# Patient Record
Sex: Female | Born: 1980 | Race: Black or African American | Hispanic: Yes | Marital: Single | Smoking: Never smoker
Health system: Southern US, Community
[De-identification: ages and names within clinical notes are randomized; demographics above are authoritative.]

## PROBLEM LIST (undated history)

## (undated) DIAGNOSIS — T148XXA Other injury of unspecified body region, initial encounter: Secondary | ICD-10-CM

## (undated) DIAGNOSIS — D649 Anemia, unspecified: Secondary | ICD-10-CM

## (undated) DIAGNOSIS — B9689 Other specified bacterial agents as the cause of diseases classified elsewhere: Secondary | ICD-10-CM

## (undated) DIAGNOSIS — Z8639 Personal history of other endocrine, nutritional and metabolic disease: Secondary | ICD-10-CM

## (undated) DIAGNOSIS — E079 Disorder of thyroid, unspecified: Secondary | ICD-10-CM

## (undated) DIAGNOSIS — N760 Acute vaginitis: Secondary | ICD-10-CM

## (undated) DIAGNOSIS — J45909 Unspecified asthma, uncomplicated: Secondary | ICD-10-CM

## (undated) HISTORY — DX: Disorder of thyroid, unspecified: E07.9

## (undated) HISTORY — DX: Unspecified asthma, uncomplicated: J45.909

## (undated) HISTORY — DX: Other injury of unspecified body region, initial encounter: T14.8XXA

## (undated) HISTORY — PX: WISDOM TOOTH EXTRACTION: SHX21

## (undated) HISTORY — DX: Personal history of other endocrine, nutritional and metabolic disease: Z86.39

## (undated) HISTORY — DX: Anemia, unspecified: D64.9

## (undated) HISTORY — DX: Other specified bacterial agents as the cause of diseases classified elsewhere: N76.0

## (undated) HISTORY — DX: Other specified bacterial agents as the cause of diseases classified elsewhere: B96.89

---

## 2005-12-29 ENCOUNTER — Emergency Department (HOSPITAL_COMMUNITY): Admission: EM | Admit: 2005-12-29 | Discharge: 2005-12-30 | Payer: Self-pay | Admitting: Emergency Medicine

## 2006-05-25 ENCOUNTER — Emergency Department (HOSPITAL_COMMUNITY): Admission: EM | Admit: 2006-05-25 | Discharge: 2006-05-26 | Payer: Self-pay | Admitting: Emergency Medicine

## 2006-07-31 ENCOUNTER — Inpatient Hospital Stay (HOSPITAL_COMMUNITY): Admission: AD | Admit: 2006-07-31 | Discharge: 2006-08-03 | Payer: Self-pay | Admitting: Obstetrics and Gynecology

## 2006-12-19 ENCOUNTER — Ambulatory Visit: Payer: Self-pay | Admitting: Internal Medicine

## 2007-05-28 ENCOUNTER — Encounter: Admission: RE | Admit: 2007-05-28 | Discharge: 2007-05-28 | Payer: Self-pay | Admitting: Internal Medicine

## 2007-12-23 ENCOUNTER — Emergency Department (HOSPITAL_COMMUNITY): Admission: EM | Admit: 2007-12-23 | Discharge: 2007-12-23 | Payer: Self-pay | Admitting: Emergency Medicine

## 2008-02-29 ENCOUNTER — Emergency Department (HOSPITAL_COMMUNITY): Admission: EM | Admit: 2008-02-29 | Discharge: 2008-03-01 | Payer: Self-pay | Admitting: Emergency Medicine

## 2008-03-03 ENCOUNTER — Encounter: Admission: RE | Admit: 2008-03-03 | Discharge: 2008-03-03 | Payer: Self-pay | Admitting: Internal Medicine

## 2008-04-21 ENCOUNTER — Emergency Department (HOSPITAL_COMMUNITY): Admission: EM | Admit: 2008-04-21 | Discharge: 2008-04-21 | Payer: Self-pay | Admitting: Family Medicine

## 2008-06-20 ENCOUNTER — Emergency Department (HOSPITAL_COMMUNITY): Admission: EM | Admit: 2008-06-20 | Discharge: 2008-06-21 | Payer: Self-pay | Admitting: Emergency Medicine

## 2008-09-03 ENCOUNTER — Emergency Department (HOSPITAL_COMMUNITY): Admission: EM | Admit: 2008-09-03 | Discharge: 2008-09-03 | Payer: Self-pay | Admitting: Emergency Medicine

## 2008-09-08 ENCOUNTER — Encounter: Admission: RE | Admit: 2008-09-08 | Discharge: 2008-09-08 | Payer: Self-pay | Admitting: Internal Medicine

## 2008-10-25 ENCOUNTER — Emergency Department (HOSPITAL_COMMUNITY): Admission: EM | Admit: 2008-10-25 | Discharge: 2008-10-25 | Payer: Self-pay | Admitting: Emergency Medicine

## 2009-01-05 ENCOUNTER — Emergency Department (HOSPITAL_COMMUNITY): Admission: EM | Admit: 2009-01-05 | Discharge: 2009-01-06 | Payer: Self-pay | Admitting: Emergency Medicine

## 2010-01-10 ENCOUNTER — Emergency Department (HOSPITAL_BASED_OUTPATIENT_CLINIC_OR_DEPARTMENT_OTHER)
Admission: EM | Admit: 2010-01-10 | Discharge: 2010-01-10 | Payer: Self-pay | Source: Home / Self Care | Admitting: Emergency Medicine

## 2010-04-17 LAB — CBC
HCT: 35.1 % — ABNORMAL LOW (ref 36.0–46.0)
MCH: 29.5 pg (ref 26.0–34.0)
MCHC: 35.9 g/dL (ref 30.0–36.0)
MCV: 82.2 fL (ref 78.0–100.0)
RDW: 12.5 % (ref 11.5–15.5)

## 2010-04-17 LAB — DIFFERENTIAL
Basophils Absolute: 0.1 10*3/uL (ref 0.0–0.1)
Basophils Relative: 1 % (ref 0–1)
Eosinophils Absolute: 0.2 10*3/uL (ref 0.0–0.7)
Eosinophils Relative: 4 % (ref 0–5)
Monocytes Absolute: 0.5 10*3/uL (ref 0.1–1.0)

## 2010-04-17 LAB — BASIC METABOLIC PANEL
BUN: 12 mg/dL (ref 6–23)
Chloride: 107 mEq/L (ref 96–112)
Creatinine, Ser: 0.7 mg/dL (ref 0.4–1.2)
Glucose, Bld: 90 mg/dL (ref 70–99)
Potassium: 3.8 mEq/L (ref 3.5–5.1)

## 2010-05-08 LAB — URINALYSIS, ROUTINE W REFLEX MICROSCOPIC
Bilirubin Urine: NEGATIVE
Hgb urine dipstick: NEGATIVE
Ketones, ur: 15 mg/dL — AB
Protein, ur: NEGATIVE mg/dL
Specific Gravity, Urine: 1.03 (ref 1.005–1.030)
Urobilinogen, UA: 0.2 mg/dL (ref 0.0–1.0)

## 2010-05-13 LAB — CBC
HCT: 34.9 % — ABNORMAL LOW (ref 36.0–46.0)
Hemoglobin: 11.8 g/dL — ABNORMAL LOW (ref 12.0–15.0)
MCHC: 33.8 g/dL (ref 30.0–36.0)
MCV: 86 fL (ref 78.0–100.0)
Platelets: 212 10*3/uL (ref 150–400)
RBC: 4.06 MIL/uL (ref 3.87–5.11)
RDW: 13.3 % (ref 11.5–15.5)
WBC: 5.6 10*3/uL (ref 4.0–10.5)

## 2010-05-13 LAB — DIFFERENTIAL
Basophils Absolute: 0 10*3/uL (ref 0.0–0.1)
Basophils Relative: 1 % (ref 0–1)
Eosinophils Absolute: 0.2 10*3/uL (ref 0.0–0.7)
Eosinophils Relative: 3 % (ref 0–5)
Lymphocytes Relative: 40 % (ref 12–46)
Lymphs Abs: 2.2 10*3/uL (ref 0.7–4.0)
Monocytes Absolute: 0.4 10*3/uL (ref 0.1–1.0)
Monocytes Relative: 8 % (ref 3–12)
Neutro Abs: 2.8 10*3/uL (ref 1.7–7.7)
Neutrophils Relative %: 49 % (ref 43–77)

## 2010-05-13 LAB — BASIC METABOLIC PANEL
CO2: 26 mEq/L (ref 19–32)
Calcium: 8.9 mg/dL (ref 8.4–10.5)
Glucose, Bld: 103 mg/dL — ABNORMAL HIGH (ref 70–99)
Sodium: 136 mEq/L (ref 135–145)

## 2010-05-13 LAB — URINALYSIS, ROUTINE W REFLEX MICROSCOPIC
Bilirubin Urine: NEGATIVE
Ketones, ur: NEGATIVE mg/dL
Nitrite: NEGATIVE
Urobilinogen, UA: 0.2 mg/dL (ref 0.0–1.0)

## 2010-05-13 LAB — POCT PREGNANCY, URINE: Preg Test, Ur: NEGATIVE

## 2010-05-15 LAB — URINALYSIS, ROUTINE W REFLEX MICROSCOPIC
Glucose, UA: NEGATIVE mg/dL
Hgb urine dipstick: NEGATIVE
Specific Gravity, Urine: 1.028 (ref 1.005–1.030)

## 2010-05-15 LAB — URINE CULTURE: Culture: NO GROWTH

## 2010-05-21 LAB — URINALYSIS, ROUTINE W REFLEX MICROSCOPIC
Protein, ur: NEGATIVE mg/dL
Specific Gravity, Urine: 1.032 — ABNORMAL HIGH (ref 1.005–1.030)
Urobilinogen, UA: 1 mg/dL (ref 0.0–1.0)

## 2010-05-21 LAB — URINE MICROSCOPIC-ADD ON

## 2010-06-19 NOTE — H&P (Signed)
NAMEKENNIDEE, HEYNE              ACCOUNT NO.:  [id number]   MEDICAL RECORD NO.:  [id number]          PATIENT TYPE:  INP   LOCATION:  9199                          FACILITY:  [facility]   PHYSICIAN:  Hal Morales, M.D.DATE OF BIRTH:  1980/12/17   DATE OF ADMISSION:  07/31/2006  DATE OF DISCHARGE:                              HISTORY & PHYSICAL   HISTORY OF PRESENT ILLNESS:  Ms. Klausner is a 30 year old gravida 3,  para 2-0-0-2 at 38-6/7 weeks who presented from a visit with the  preoperative nurse with uterine contractions reported every 20 minutes.  She was seen at the office today with cervix 3 cm, 70%.  She is  scheduled for repeat cesarean section on August 05, 2006.  She denies any  leaking or bleeding and reports positive fetal movement.   Pregnancy has been remarkable for:  1. Previous cesarean section with prior spontaneous vaginal birth with      the patient planning a repeat C-section.  2. History of oligohydramnios.  3. Questionable last menstrual period with EDC established by 17 week      ultrasound.  4. Group B strep negative.   PRENATAL LABS:  Blood type is B positive.  Rh antibody negative.  VDRL  nonreactive.  Rubella titer positive.  Hepatitis B surface antigen  negative.  HIV nonreactive.  Cystic fibrosis testing negative.  Sickle  cell test was negative.  Pap, GC and Chlamydia were done, which were all  normal.  Quadruple screen was normal as well.  One-hour Glucola was  negative.  Patient had a negative fetal fibronectin at 26 weeks.  Her  hemoglobin upon entry into practice was 11.3, it was 10.6 at 26 weeks.  GC and Chlamydia cultures were also negative at 28 weeks.  Group B strep  culture, GC and Chlamydia cultures were all negative at 36 weeks.  EDC  of August 09, 2006, was established by 17-week ultrasound.   HISTORY OF PRESENT PREGNANCY:  Patient had carried approximately 17  weeks.  She had been seen in maternity admissions prior to that for  nausea.  She was  placed on Ensure at her first visit secondary to weight  loss in pregnancy.  A low transverse cesarean section with double  closure was confirmed by operative note.  She initially was undecided  about repeat C-section or VBAC.  She had an ultrasound at 18 weeks  showing normal growth and development.  She had a normal Glucola.  Hemoglobin was 10.6 at 26 weeks.  She began to lean toward repeat  cesarean section.  She had another ultrasound at 32 weeks showing normal  growth at 78.6%.  By 34 weeks she was deciding on a repeat cesarean  section.  That was scheduled for August 05, 2006.   OBSTETRICAL HISTORY:  In 2003 she had a vaginal birth of a female  infant, weight 6 pounds 7 ounces at 39 weeks.  She was in labor 36  hours.  She had epidural anesthesia.  The child was born in [place],  [place].  In March of 2007 she had a primary low transverse cesarean  section for a female infant, weight 8 pounds 4 ounces at 39 weeks.  She  was in labor 17 hours.  She had epidural anesthesia.  She progressed to  8 cm and had failure to progress from that point.  She also had some low  fluid, and the baby was posterior.  She was on iron during her first  pregnancy.  She also had nausea, vomiting, a weight loss in both  pregnancies.  She had oligohydramnios with her second pregnancy.  She  had Chlamydia with her first pregnancy.  She also had positive group B  strep with her first pregnancy.   PAST MEDICAL HISTORY:  She is a previous Depo-Provera user, with which  she had bleeding after her second dose.  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  She had been on the patch and  the progesterone-only pills.  She has had a history of cysts on both  breaths that both went away.  She does have a history of yeast  infections and bacterial infections.  She has a history of bronchitis.  She has a history of anemia with her pregnancy.  She had a recent UTI  prior to pregnancy.  Surgical history  includes wisdom teeth on top  removed and her C-section.  She did have a blood pressure drop with her  epidural.  The epidural took a while to take.  She is sensitive to  tomatoes, fresh pineapple which cause swelling, cats and grass and  pollen.   FAMILY HISTORY:  Her maternal grandmother is deceased from a heart  attack.  Her mother and father have elevated cholesterol.  Her maternal  grandmother had asthma.  Her mother has anemia.  Mother had a positive  PPD.  She also had partial thyroid removed.  Her mother had thyroid  cancer and had removal of cancer cells off her cervix.  Maternal  grandfather has a history of alcohol and cirrhosis of the liver.  Genetic history is remarkable for the father of the baby's first cousin  having cerebral palsy and the father of the baby's father being a twin.   SOCIAL HISTORY:  Patient is single.  Father of the baby is involved in  sports; his name is Doree Barthel.  Patient is graduate-educated.  She  is a Futures trader.  Her partner has a high school education.  He is a  Estate agent.  She has been followed by the physician service  [facility].  She denies any alcohol, drug or tobacco use during  this pregnancy.   PHYSICAL EXAMINATION:  VITAL SIGNS:  Stable.  Patient is afebrile.  HEENT:  Within normal limits.  LUNGS:  Bilateral breath sounds are clear.  HEART:  Regular rate and rhythm without murmur.  BREASTS:  Soft and nontender.  ABDOMEN:  Fundal height is approximately 38 cm.  Estimated fetal weight  is 7 to 7-1/2 pounds.  Uterine contractions are every 2-5 minutes now,  mild quality.  Cervix is 3 cm, 70%, vertex, with a -1 station.  Fetal  heart rate is reactive with no decelerations.  EXTREMITIES:  Deep tendon reflexes are 2+ without clonus.  There is  trace edema noted.   IMPRESSION:  1. Intrauterine pregnancy at 38-6/7 weeks.  2. Probable early labor.  3. Scheduled for cesarean on August 05, 2006.  4. Group B strep  negative.   PLAN:  1. Admit to [facility] for consult with Dr. Erie Noe  Haygood as attending physician.  2. Routine physician preoperative orders.  3. Risks and benefits of repeat C-section reviewed with the patient      including bleeding, infection and damage to other organs.  This was      reviewed with the patient by myself as well as Dr. Pennie Rushing.      Patient seems to understand these risks and desires to proceed with      C-section.      Renaldo Reel Emilee Hero, C.N.M.      Hal Morales, M.D.  Electronically Signed   VLL/MEDQ  D:  07/31/2006  T:  07/31/2006  Job:  [id number]

## 2010-06-19 NOTE — Discharge Summary (Signed)
NAMESHANVI, Madeline Gomez              ACCOUNT NO.:  [id number]   MEDICAL RECORD NO.:  [id number]          PATIENT TYPE:  INP   LOCATION:  9147                          [facility]   PHYSICIAN:  Naima A. Dillard, M.D. DATE OF BIRTH:  [birth year]   DATE OF ADMISSION:  07/31/2006  DATE OF DISCHARGE:  08/03/2006                               DISCHARGE SUMMARY   ADMITTING DIAGNOSES:  1. Intrauterine pregnancy at 68 and 6/7th weeks.  2. Previous cesarean section with desire for repeat.  3. Early labor.   DISCHARGE DIAGNOSES:  1. Intrauterine pregnancy at term.  2. Prior cesarean section in labor.  3. 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   PROCEDURES:  1. Repeat low transverse cesarean section.  2. Revision of previous cheloid scar.   HOSPITAL COURSE:  Madeline Gomez is a 30 year old, gravida 3, para 2-0-0-2,  at 18 and 6/7th weeks, who presented in early labor on the morning of  July 31, 2006.  She had previously been scheduled for a repeat cesarean  section on July 1st; however, her cervix was 3 cm, 70%, vertex minus  one, the patient was contracting every two to four minutes mildly, and  the decision was made to proceed with repeat cesarean section.  The  patient was taken to the operating room where a repeat low transverse  cesarean section was performed by Dr. Dierdre Forth under spinal  anesthesia.  The patient also had a removal of the cheloid scar from her  previous incision and scar revision.  Findings were a viable female by  the name of Madeline Gomez, weight 7 pounds 6 ounces, Apgar's were 8 and 9.  The infant was taken to the full-term nursery, mother was taken to  recovery in good condition.  By postop day one, the patient was doing  well.  She was having good pain management.  Her hemoglobin was 10.5,  white blood cell count was 8.8, and platelet count was 124.  It had been  143 on her pre-delivery value.  It had been 225 at her new OB visit.  The patient did begin to have some  cracked nipples.  Comfort measures  and relief measures were submitted for this.  By postop day three, the  patient was doing well.  She was up ad.lib and tolerating a regular  diet, although she did have diminished appetite as she had had  throughout her pregnancy.  She had been on Ensure during her pregnancy,  one can p.o. t.i.d., for nutritional supplementation.  By postop day  three, she was breastfeeding and that was going fairly well.  The  patient was deemed to have received full benefit of her hospital stay  and was discharged home.  She was going to use Micronor for birth  control at present.   DISCHARGE CONDITION:  Stable.   DISCHARGE INSTRUCTIONS:  Per [facility] handout.   DISCHARGE MEDICATIONS:  1. Motrin 600 mg p.o. q.6h p.r.n. pain.  2. Percocet 5/325 one to two p.o. q.3-4h p.r.n. pain.  3. Micronor one p.o. daily.  4. Ensure one can p.o. t.i.d.   DISCHARGE  FOLLOWUP:  Discharge followup will occur in six weeks at  [facility].      Madeline Gomez, C.N.M.      Naima A. Normand Sloop, M.D.  Electronically Signed    VLL/MEDQ  D:  08/03/2006  T:  08/03/2006  Job:  [id number]

## 2010-06-19 NOTE — Op Note (Signed)
Madeline Gomez, Madeline Gomez              ACCOUNT NO.:  [id number]   MEDICAL RECORD NO.:  [id number]          PATIENT TYPE:  INP   LOCATION:  9147                          [facility]   PHYSICIAN:  Hal Morales, M.D.DATE OF BIRTH:  [birth year]   DATE OF PROCEDURE:  07/31/2006  DATE OF DISCHARGE:                               OPERATIVE REPORT   PREOPERATIVE DIAGNOSIS:  Intrauterine pregnancy at term, prior cesarean  section in labor, desire for repeat cesarean section, keloid scar.   POSTOPERATIVE DIAGNOSES:  Intrauterine pregnancy at term, prior cesarean  section in labor, desire for repeat cesarean section, keloid scar.   OPERATION:  Repeat low transverse cesarean section, incision revision.   SURGEON:  Dr. Dierdre Forth   FIRST ASSISTANT:  Renaldo Reel. Emilee Hero, C.N.M.   ANESTHESIA:  Spinal.   ESTIMATED BLOOD LOSS:  750 mL.   COMPLICATIONS:  None.   FINDINGS:  The patient was delivered of a female infant whose name is  Brooklynne weighing 7 pounds 6 ounces with Apgars of 8 and 9 at 1 and 5  minutes respectively.  The uterus, tubes and ovaries were normal for  gravid state.   PROCEDURE:  The patient was taken to the operating room after  appropriate identification and placed on the operating table.  After the  placement of a spinal anesthetic she was placed in the supine position  with a left lateral tilt.  The abdomen and perineum were prepped with  multiple layers of Betadine and a Foley catheter inserted into the  bladder and connected to straight drainage.  The abdomen was draped as a  sterile field.  After assurance of adequate anesthesia, the incision  site was infiltrated with 10 mL of quarter percent Marcaine.  The skin  was incised above and beyond the previous incision and that scar removed  sharply.  The abdomen was then opened in layers and the peritoneum  entered.  The bladder blade was placed.  The uterus was incised  approximately 2 cm above the uterovesical  fold and that incision taken  laterally on either side bluntly.  The infant was delivered from the  occiput transverse position and after having the nares and pharynx  suctioned and cord clamped and cut was handed off to the awaiting  pediatricians.  The appropriate cord blood was drawn and placenta noted  to have separated from the uterus and was removed from the operative  field.  The uterine incision was closed with a running interlocking  suture of 0 Vicryl.  An imbricating suture of 0 Vicryl was placed with  adequate hemostasis.  Copious irrigation was carried out.  The abdominal  peritoneum was closed with a running suture of 2-0 Vicryl.  The rectus  muscles were reapproximated in the midline with figure-of-eight suture  of 2-0 Vicryl.  The rectus fascia was closed with running suture of 0  Vicryl then reinforced on either side of midline with figure-of-eight  sutures of 0 Vicryl.  The subcutaneous tissue was made hemostatic with  Bovie cautery and irrigated.  The skin incision was closed with a  subcuticular suture  of 3-0 Monocryl.  Steri-Strips and a sterile  dressing were applied.  The patient was taken from the operating room to  the recovery room in satisfactory condition having tolerated the  procedure well with sponge and instrument counts correct.  The infant  went to the full-term nursery.      Hal Morales, M.D.  Electronically Signed     VPH/MEDQ  D:  07/31/2006  T:  07/31/2006  Job:  [id number]

## 2010-11-06 LAB — BASIC METABOLIC PANEL
BUN: 6
CO2: 25
Chloride: 105
Creatinine, Ser: 0.72
GFR calc Af Amer: >60

## 2010-11-06 LAB — URINALYSIS, ROUTINE W REFLEX MICROSCOPIC
Glucose, UA: NEGATIVE
Nitrite: NEGATIVE
Protein, ur: NEGATIVE

## 2010-11-06 LAB — CBC
MCHC: 33.3
MCV: 87.9
RBC: 4.05

## 2010-11-06 LAB — DIFFERENTIAL
Basophils Relative: 0
Eosinophils Absolute: 0
Monocytes Relative: 9
Neutrophils Relative %: 84 — ABNORMAL HIGH

## 2010-11-06 LAB — URINE MICROSCOPIC-ADD ON

## 2010-11-21 LAB — CBC
HCT: 30.9 — ABNORMAL LOW
MCHC: 33
Platelets: 124 — ABNORMAL LOW
Platelets: 143 — ABNORMAL LOW
RBC: 4.15
RDW: 13.9
WBC: 8.8

## 2010-11-21 LAB — RPR: RPR Ser Ql: NONREACTIVE

## 2011-10-09 ENCOUNTER — Encounter: Payer: Self-pay | Admitting: Obstetrics and Gynecology

## 2011-10-09 ENCOUNTER — Ambulatory Visit (INDEPENDENT_AMBULATORY_CARE_PROVIDER_SITE_OTHER): Payer: 59 | Admitting: Obstetrics and Gynecology

## 2011-10-09 VITALS — BP 112/72 | HR 78 | Wt 154.0 lb

## 2011-10-09 DIAGNOSIS — Z30433 Encounter for removal and reinsertion of intrauterine contraceptive device: Secondary | ICD-10-CM

## 2011-10-09 DIAGNOSIS — Z30430 Encounter for insertion of intrauterine contraceptive device: Secondary | ICD-10-CM

## 2011-10-09 DIAGNOSIS — N898 Other specified noninflammatory disorders of vagina: Secondary | ICD-10-CM

## 2011-10-09 LAB — POCT WET PREP (WET MOUNT)
Whiff Test: NEGATIVE
pH: 4.5

## 2011-10-09 LAB — POCT URINE PREGNANCY: Preg Test, Ur: NEGATIVE

## 2011-10-09 MED ORDER — AMBULATORY NON FORMULARY MEDICATION: 600.0000 mg | Status: DC

## 2011-10-09 MED ORDER — LEVONORGESTREL 20 MCG/24HR IU IUD
INTRAUTERINE_SYSTEM | Freq: Once | INTRAUTERINE | Status: AC
  Administered 2011-10-09: 1 via INTRAUTERINE

## 2011-10-09 NOTE — Progress Notes (Signed)
IUD INSERTION NOTE  Madeline Gomez is a 31 y.o. female G3P0003 who presents for IUD removal and reinsertion.  Complains of vaginal discharge (brownish) that she would like checked. Symptoms are the same as previous BV infections for which Boric Acid Capsules helped but was unable to afford them until recently.  Consent signed after risks and benefits were reviewed including but not limited to bleeding, infection, expulsion and risk of uterine perforation that may require an additional procedure for removal.  LMP: No LMP recorded. Patient is not currently having periods (Reason: IUD). UJW:[id number]   Uterus assessed for size and position Prepped with Betadine Tenaculum placed on anterior lip of cervix after Hurricane gel was applied Uterus sounded at  9  cm Insertion of MIRENA IUD per protocol without any complications Strings trimmed  Wet Prep: negative  Assessment:  IUD Insertion                        Vaginitis Symptoms  Plan:  1. Patient instructed to call with oral temperature of 100.4 degrees Fahrenheit or more, excessive bleeding or pain that is not relieved with OTC analgesia taken as directed  2. Patient instructed on how  to check IUD strings and encouraged to do so after each menstrual cycle  3. Advised not to place anything in vagina or have sexual intercourse for 7 days  4. Follow-up: 4  weeks  5. Boric Acid Suppositories 600 mg #30  1 pv twice weekly x 12 weeks then prn refills x 11   Eunice Oldaker PA-C 10/09/2011 9:38 AM

## 2011-10-09 NOTE — Patient Instructions (Addendum)
Keep follow up appointment in 4 weeks  Call [facility] [phone]:  -for temperature of 100.4 degrees Fahrenheit or more -pain not improved with over the counter pain medications (Ibuprofen, Advil, Aleve,        Tylenol or acetaminophen) -for excessive bleeding (more than a usual period) -for any other concerns  Do not place anything in your vagina for the next 7 days   

## 2011-10-28 ENCOUNTER — Ambulatory Visit: Payer: Self-pay | Admitting: Obstetrics and Gynecology

## 2011-11-08 ENCOUNTER — Encounter: Payer: 59 | Admitting: Obstetrics and Gynecology

## 2011-11-11 ENCOUNTER — Encounter: Payer: 59 | Admitting: Obstetrics and Gynecology

## 2011-11-11 ENCOUNTER — Ambulatory Visit: Payer: 59 | Admitting: Obstetrics and Gynecology

## 2011-11-12 ENCOUNTER — Ambulatory Visit: Payer: 59 | Admitting: Obstetrics and Gynecology

## 2011-11-25 ENCOUNTER — Ambulatory Visit (INDEPENDENT_AMBULATORY_CARE_PROVIDER_SITE_OTHER): Payer: 59 | Admitting: Obstetrics and Gynecology

## 2011-11-25 ENCOUNTER — Encounter: Payer: Self-pay | Admitting: Obstetrics and Gynecology

## 2011-11-25 VITALS — BP 118/78 | HR 80 | Ht 63.25 in | Wt 164.0 lb

## 2011-11-25 DIAGNOSIS — Z113 Encounter for screening for infections with a predominantly sexual mode of transmission: Secondary | ICD-10-CM

## 2011-11-25 DIAGNOSIS — Z124 Encounter for screening for malignant neoplasm of cervix: Secondary | ICD-10-CM

## 2011-11-25 DIAGNOSIS — N898 Other specified noninflammatory disorders of vagina: Secondary | ICD-10-CM

## 2011-11-25 DIAGNOSIS — Z01419 Encounter for gynecological examination (general) (routine) without abnormal findings: Secondary | ICD-10-CM

## 2011-11-25 DIAGNOSIS — E559 Vitamin D deficiency, unspecified: Secondary | ICD-10-CM

## 2011-11-25 NOTE — Progress Notes (Signed)
Regular Periods: no  IUD Mammogram: no  Monthly Breast Ex.: yes Exercise: no  Tetanus < 10 years: no Seatbelts: yes  NI. Bladder Functn.: yes Abuse at home: no  Daily BM's: yes Stressful Work: no  Healthy Diet: yes Sigmoid-Colonoscopy: NO  Calcium: no Medical problems this year: CHECK TO MAKE SURE IUD IS IN PLACE   LAST PAP:8/11  Contraception: IUD MIRENA  Mammogram:  NO  PCP:  DR. Lovell Sheehan  PMH: NO CHANGE  FMH: NO CHANGE  Last Bone Scan: NO  PT IS SINGLE;IN A RELATIONSHIP

## 2011-11-25 NOTE — Progress Notes (Signed)
Subjective:    Madeline Gomez is a 31 y.o. female, G3P0003, who presents for an annual exam. The patient requests STD testing. Has no complaints since IUD reinsertion last month.  Menstrual cycle:   LMP: Patient's last menstrual period was 11/25/2011.              Review of Systems Pertinent items are noted in HPI. Denies pelvic pain, urinary tract symptoms, vaginitis symptoms, irregular bleeding, menopausal symptoms, change in bowel habits or rectal bleeding   Objective:    BP 118/78  Pulse 80  Ht 5' 3.25" (1.607 m)  Wt 164 lb (74.39 kg)  BMI 28.82 kg/m2  LMP 11/25/2011    Wt Readings from Last 1 Encounters:  11/25/11 164 lb (74.39 kg)   Body mass index is 28.82 kg/(m^2). General Appearance: Alert, no acute distress HEENT: Grossly normal Neck / Thyroid: Supple, no thyromegaly or cervical adenopathy Lungs: Clear to auscultation bilaterally Back: No CVA tenderness Breast Exam: No masses or nodes.No dimpling, nipple retraction or discharge. Cardiovascular: Regular rate and rhythm.  Gastrointestinal: Soft, non-tender, no masses or organomegaly Pelvic Exam: EGBUS-wnl, vagina-normal rugae, cervix-strings visible,  without lesions or tenderness, uterus appears normal size shape and consistency, adnexae-no masses or tenderness Rectovaginal: no masses and normal sphincter tone Lymphatic Exam: Non-palpable nodes in neck, clavicular,  axillary, or inguinal regions  Skin: no rashes or abnormalities Extremities: no clubbing cyanosis or edema  Neurologic: grossly normal Psychiatric: Alert and oriented  Wet Prep: pH-4.5, whiff-negative,  no clue, trich, yeast   Assessment:   Routine GYN Exam   Plan:  IUD Follow up  PAP sent  RTO 1 year or prn  June Vacha,ELMIRAPA-C

## 2011-11-26 ENCOUNTER — Other Ambulatory Visit: Payer: Self-pay

## 2011-11-26 ENCOUNTER — Telehealth: Payer: Self-pay

## 2011-11-26 DIAGNOSIS — E559 Vitamin D deficiency, unspecified: Secondary | ICD-10-CM

## 2011-11-26 LAB — PAP IG, CT-NG, RFX HPV ASCU

## 2011-11-26 LAB — HSV 2 ANTIBODY, IGG: HSV 2 Glycoprotein G Ab, IgG: <0.1 IV

## 2011-11-26 LAB — HEPATITIS B SURFACE ANTIGEN: Hepatitis B Surface Ag: NEGATIVE

## 2011-11-26 LAB — HEPATITIS C ANTIBODY: HCV Ab: NEGATIVE

## 2011-11-26 MED ORDER — VITAMIN D (ERGOCALCIFEROL) 1.25 MG (50000 UNIT) PO CAPS: ORAL_CAPSULE | ORAL | Status: DC

## 2011-11-26 NOTE — Telephone Encounter (Signed)
TC TO PT REGARDING VIT D LEVEL. INFORMED PT THAT VIT D IS LOW AND WE NEED TO CALL IN RX FOR PT. WENT OVER VIT D PROTOCOL AND WILL SEND RX TO PT PHARMACY. PT VOICED UNDERSTANDING.

## 2011-11-26 NOTE — Telephone Encounter (Signed)
Message copied by Winfred Leeds on Tue Nov 26, 2011  2:56 PM ------      Message from: Henreitta Leber      Created: Tue Nov 26, 2011  7:58 AM       Please execute the Vitamin D Protocol for this patient.  Thank you.  EP

## 2011-12-11 DIAGNOSIS — J45901 Unspecified asthma with (acute) exacerbation: Secondary | ICD-10-CM | POA: Insufficient documentation

## 2011-12-11 DIAGNOSIS — R0789 Other chest pain: Secondary | ICD-10-CM | POA: Insufficient documentation

## 2011-12-11 DIAGNOSIS — Z79899 Other long term (current) drug therapy: Secondary | ICD-10-CM | POA: Insufficient documentation

## 2011-12-11 DIAGNOSIS — Z862 Personal history of diseases of the blood and blood-forming organs and certain disorders involving the immune mechanism: Secondary | ICD-10-CM | POA: Insufficient documentation

## 2011-12-11 DIAGNOSIS — E559 Vitamin D deficiency, unspecified: Secondary | ICD-10-CM | POA: Insufficient documentation

## 2011-12-12 ENCOUNTER — Encounter (HOSPITAL_COMMUNITY): Payer: Self-pay | Admitting: Adult Health

## 2011-12-12 ENCOUNTER — Emergency Department (HOSPITAL_COMMUNITY)
Admission: EM | Admit: 2011-12-12 | Discharge: 2011-12-12 | Disposition: A | Discharge status: Home / Self Care | Payer: 59 | Attending: Emergency Medicine | Admitting: Emergency Medicine

## 2011-12-12 DIAGNOSIS — J45901 Unspecified asthma with (acute) exacerbation: Secondary | ICD-10-CM

## 2011-12-12 MED ORDER — ALBUTEROL SULFATE (2.5 MG/3ML) 0.083% IN NEBU: 2.5000 mg | INHALATION_SOLUTION | Freq: Four times a day (QID) | RESPIRATORY_TRACT | Status: DC | PRN

## 2011-12-12 MED ORDER — IPRATROPIUM BROMIDE 0.02 % IN SOLN
0.5000 mg | Freq: Once | RESPIRATORY_TRACT | Status: AC
  Administered 2011-12-12: 0.5 mg via RESPIRATORY_TRACT
  Filled 2011-12-12: qty 2.5

## 2011-12-12 MED ORDER — METHYLPREDNISOLONE SODIUM SUCC 125 MG IJ SOLR
125.0000 mg | Freq: Once | INTRAMUSCULAR | Status: AC
  Administered 2011-12-12: 125 mg via INTRAMUSCULAR
  Filled 2011-12-12: qty 2

## 2011-12-12 MED ORDER — ALBUTEROL SULFATE HFA 108 (90 BASE) MCG/ACT IN AERS
2.0000 | INHALATION_SPRAY | RESPIRATORY_TRACT | Status: DC | PRN
  Administered 2011-12-12: 2 via RESPIRATORY_TRACT
  Filled 2011-12-12: qty 6.7

## 2011-12-12 MED ORDER — ALBUTEROL SULFATE (5 MG/ML) 0.5% IN NEBU
5.0000 mg | INHALATION_SOLUTION | Freq: Once | RESPIRATORY_TRACT | Status: AC
  Administered 2011-12-12: 5 mg via RESPIRATORY_TRACT
  Filled 2011-12-12: qty 1

## 2011-12-12 NOTE — ED Notes (Signed)
MD at bedside. 

## 2011-12-12 NOTE — ED Notes (Signed)
Presents with SOB that began two weeks ago and is intermittent. Has a hx of asthma and has gone through a box of albuterol in 3 days. Pt is able to speak in full sentences, bilateral diminished lower breath sounds. Reports sharp left sided pain that is worse with movement and is intermittent, took a albuterol breathing treatment at 6 pm and the chest pain subsided.

## 2011-12-12 NOTE — ED Notes (Signed)
Pt A.O x 4.  Respirations even and regular. NAD. Skin warm, dry, and intact. Denies SOB. Denies chest tightness. Denies pain. Verbalized understanding of discharge instructions.  Able to locate information for follow up with PCP. No further questions at this time.

## 2011-12-12 NOTE — ED Notes (Signed)
Pt ambulated with pulse Oximetry. Base line SpO2 100%. Maintained SpO2 98% or higher during ambulation. Denies SOB.

## 2011-12-12 NOTE — ED Provider Notes (Signed)
History     CSN: [id number]  Arrival date & time 12/11/11  2353   First MD Initiated Contact with Patient 12/12/11 0308      Chief Complaint  Patient presents with  . Asthma    (Consider location/radiation/quality/duration/timing/severity/associated sxs/prior treatment) HPI HX per PT. Has asthma and over the last few days worsening cough and wheezing, worse tonight with associated chest tightness, no h/o admit or intubation for asthma. No F/C, no productive cough, mod in severity, no back pain. No leg pain.  Past Medical History  Diagnosis Date  . H/O vitamin D deficiency   . Anemia   . Asthma   . BV (bacterial vaginosis)     RECURRENT  . Broken bones     RIGHT FOOT     Past Surgical History  Procedure Date  . Cesarean section   . Wisdom tooth extraction     Family History  Problem Relation Age of Onset  . Glaucoma Paternal Grandfather   . Glaucoma Paternal Grandmother   . Heart disease Maternal Grandmother   . Asthma Maternal Grandmother   . Anemia Maternal Grandmother   . Hyperlipidemia Father   . Cancer Mother     THYROID AND CERVICAL  . Hypertension Mother     History  Substance Use Topics  . Smoking status: Never Smoker   . Smokeless tobacco: Never Used  . Alcohol Use: Yes    OB History    Grav Para Term Preterm Abortions TAB SAB Ect Mult Living   3 3  0      3      Review of Systems  Constitutional: Negative for fever and chills.  HENT: Negative for sore throat, trouble swallowing, neck pain and neck stiffness.   Eyes: Negative for pain.  Respiratory: Positive for cough, chest tightness and wheezing.   Cardiovascular: Negative for palpitations and leg swelling.  Gastrointestinal: Negative for abdominal pain.  Genitourinary: Negative for dysuria.  Musculoskeletal: Negative for back pain.  Skin: Negative for rash.  Neurological: Negative for headaches.  All other systems reviewed and are negative.    Allergies  Other; Percocet; and  Prednisone  Home Medications   Current Outpatient Rx  Name  Route  Sig  Dispense  Refill  . ALBUTEROL SULFATE HFA 108 (90 BASE) MCG/ACT IN AERS   Inhalation   Inhale 2 puffs into the lungs every 6 (six) hours as needed. Wheezing or shortness of breath         . ALBUTEROL SULFATE (2.5 MG/3ML) 0.083% IN NEBU   Nebulization   Take 2.5 mg by nebulization every 6 (six) hours as needed. Wheezing or shortness of breath         . VITAMIN D (ERGOCALCIFEROL) 50000 UNITS PO CAPS      TAKE ONE CAPSULE 1 TIME WEEKLY FOR 12 WEEKS.   20 capsule   0     BP 101/63  Pulse 83  Temp 98.1 F (36.7 C) (Oral)  Resp 12  SpO2 100%  LMP 11/25/2011  Physical Exam  Constitutional: She is oriented to person, place, and time. She appears well-developed and well-nourished.  HENT:  Head: Normocephalic and atraumatic.  Mouth/Throat: Oropharynx is clear and moist.  Eyes: EOM are normal. Pupils are equal, round, and reactive to light.  Neck: Neck supple. No tracheal deviation present.  Cardiovascular: Normal rate, regular rhythm and intact distal pulses.   Pulmonary/Chest: Effort normal. No stridor. No respiratory distress.       Mild bilateral exp  wheezes  Abdominal: Soft. Bowel sounds are normal. There is no tenderness. There is no rebound.  Musculoskeletal: Normal range of motion. She exhibits no edema and no tenderness.  Neurological: She is alert and oriented to person, place, and time.  Skin: Skin is warm and dry.    ED Course  Procedures (including critical care time)   1. Acute asthma flare    Albuterol and solu-medrol for h/o prednisone allergy ( rash, and itching).    Recheck lungs clear, given 2 puffs albuterol inhaler.   5:36 AM feeling much better, NAD, lungs clear, stable for d./c home. Albuterol RX provided. Plan close PCP follow up/ return here for any return of symptoms. No indication for admit or further work up.   MDM    acute asthma exacerbation - improved with  medications as above. VS and nursing notes reviewed. Serial evaluations performed. Inhaler provided         Sunnie Nielsen, MD 12/12/11 [phone]

## 2011-12-12 NOTE — ED Notes (Signed)
Pt A.O. X 4. Denies SOB. Respirations even and at regular rate. Lung sounds diminished bilaterally. Complains of chest tightness.

## 2011-12-12 NOTE — ED Notes (Signed)
Pt reports chest tightness bilaterally.

## 2012-03-21 ENCOUNTER — Other Ambulatory Visit: Payer: Self-pay

## 2012-03-30 ENCOUNTER — Telehealth: Payer: Self-pay | Admitting: Obstetrics and Gynecology

## 2012-03-30 ENCOUNTER — Encounter: Payer: Self-pay | Admitting: Obstetrics and Gynecology

## 2012-03-30 NOTE — Telephone Encounter (Signed)
Fax received for Rf Vit. D. Pt has not had repeat lab. TC to pt. LM to return call.

## 2012-03-30 NOTE — Progress Notes (Unsigned)
FAx received to RF Vit D. Last RF 03/17/12. Denied. Pt to call office  if needs RF.

## 2012-05-10 ENCOUNTER — Encounter (HOSPITAL_COMMUNITY): Payer: Self-pay | Admitting: Physical Medicine and Rehabilitation

## 2012-05-10 ENCOUNTER — Emergency Department (HOSPITAL_COMMUNITY)
Admission: EM | Admit: 2012-05-10 | Discharge: 2012-05-10 | Disposition: A | Discharge status: Home / Self Care | Payer: 59 | Attending: Emergency Medicine | Admitting: Emergency Medicine

## 2012-05-10 DIAGNOSIS — Z8742 Personal history of other diseases of the female genital tract: Secondary | ICD-10-CM | POA: Insufficient documentation

## 2012-05-10 DIAGNOSIS — J309 Allergic rhinitis, unspecified: Secondary | ICD-10-CM | POA: Insufficient documentation

## 2012-05-10 DIAGNOSIS — Z79899 Other long term (current) drug therapy: Secondary | ICD-10-CM | POA: Insufficient documentation

## 2012-05-10 DIAGNOSIS — Z8781 Personal history of (healed) traumatic fracture: Secondary | ICD-10-CM | POA: Insufficient documentation

## 2012-05-10 DIAGNOSIS — J45909 Unspecified asthma, uncomplicated: Secondary | ICD-10-CM | POA: Insufficient documentation

## 2012-05-10 DIAGNOSIS — Z862 Personal history of diseases of the blood and blood-forming organs and certain disorders involving the immune mechanism: Secondary | ICD-10-CM | POA: Insufficient documentation

## 2012-05-10 DIAGNOSIS — Z8639 Personal history of other endocrine, nutritional and metabolic disease: Secondary | ICD-10-CM | POA: Insufficient documentation

## 2012-05-10 DIAGNOSIS — J45901 Unspecified asthma with (acute) exacerbation: Secondary | ICD-10-CM | POA: Insufficient documentation

## 2012-05-10 DIAGNOSIS — R059 Cough, unspecified: Secondary | ICD-10-CM | POA: Insufficient documentation

## 2012-05-10 DIAGNOSIS — R05 Cough: Secondary | ICD-10-CM | POA: Insufficient documentation

## 2012-05-10 MED ORDER — DEXAMETHASONE SODIUM PHOSPHATE 10 MG/ML IJ SOLN
10.0000 mg | Freq: Once | INTRAMUSCULAR | Status: AC
  Administered 2012-05-10: 10 mg via INTRAMUSCULAR
  Filled 2012-05-10: qty 1

## 2012-05-10 MED ORDER — BUDESONIDE 180 MCG/ACT IN AEPB: 1.0000 | INHALATION_SPRAY | Freq: Two times a day (BID) | RESPIRATORY_TRACT | Status: DC

## 2012-05-10 MED ORDER — ALBUTEROL SULFATE HFA 108 (90 BASE) MCG/ACT IN AERS: 2.0000 | INHALATION_SPRAY | RESPIRATORY_TRACT | Status: DC | PRN

## 2012-05-10 MED ORDER — ALBUTEROL SULFATE (5 MG/ML) 0.5% IN NEBU
2.5000 mg | INHALATION_SOLUTION | RESPIRATORY_TRACT | Status: DC
  Administered 2012-05-10: 2.5 mg via RESPIRATORY_TRACT
  Filled 2012-05-10: qty 0.5

## 2012-05-10 MED ORDER — BECLOMETHASONE DIPROPIONATE 80 MCG/ACT IN AERS: 1.0000 | INHALATION_SPRAY | Freq: Two times a day (BID) | RESPIRATORY_TRACT | Status: DC

## 2012-05-10 MED ORDER — IPRATROPIUM BROMIDE 0.02 % IN SOLN
0.5000 mg | RESPIRATORY_TRACT | Status: DC
  Administered 2012-05-10: 0.5 mg via RESPIRATORY_TRACT
  Filled 2012-05-10: qty 2.5

## 2012-05-10 NOTE — ED Provider Notes (Signed)
History    This chart was scribed for non-physician practitioner working with Vida Roller, MD by Sofie Rower, ED Scribe. This patient was seen in room TR09C/TR09C and the patient's care was started at 3:59PM.   CSN: [id number]  Arrival date & time 05/10/12  1556   First MD Initiated Contact with Patient 05/10/12 1559       Chief Complaint  Patient presents with  . Asthma  . Shortness of Breath    (Consider location/radiation/quality/duration/timing/severity/associated sxs/prior treatment) Patient is a 32 y.o. female presenting with shortness of breath. The history is provided by the patient. No language interpreter was used.  Shortness of Breath Severity:  Moderate Onset quality:  Gradual Duration:  2 weeks Timing:  Constant Progression:  Worsening Chronicity:  Recurrent Context: known allergens   Relieved by:  Nothing Worsened by:  Weather changes and coughing Ineffective treatments:  Inhaler (albuterol inhlaer, nebulizer) Associated symptoms: cough and wheezing   Associated symptoms: no abdominal pain, no chest pain, no claudication, no diaphoresis, no ear pain, no fever, no headaches, no hemoptysis, no neck pain, no PND, no rash, no sore throat, no sputum production, no syncope, no swollen glands and no vomiting   Cough:    Cough characteristics:  Non-productive   Sputum characteristics:  Unable to specify   Severity:  Moderate   Onset quality:  Gradual   Duration:  2 weeks   Timing:  Intermittent   Progression:  Worsening   Chronicity:  New Wheezing:    Severity:  Moderate   Onset quality:  Gradual   Duration:  2 weeks   Timing:  Constant   Progression:  Worsening   Chronicity:  Recurrent   Madeline Gomez is a 32 y.o. female , with a hx of asthma, seasonal allergies, and anemia, who presents to the Emergency Department complaining of gradual, progressively worsening, shortness of breath, onset two weeks ago.  Associated symptoms include wheezing, non productive  cough. The pt reports she has a hx of asthma and seasonal allergies and has been taking her sons albuterol inhaler (2-3 puffs at each setting) in addition to utilizing his nebulizer, however, neither of which provide relief of her asthma. The pt also explains, the reason why she has been utilizing her sons medications is due to running out of her own scheduled dosage. Additionally, pt has taken zyrtec which she informs does not provide relief of her asthmatic symptoms.    Furthermore, the pt denies any previous hospitalizations with regards to her asthma.   The pt does not smoke, however, she does drink alcohol.   PCP is Dr. Lovell Sheehan.    Past Medical History  Diagnosis Date  . H/O vitamin D deficiency   . Anemia   . Asthma   . BV (bacterial vaginosis)     RECURRENT  . Broken bones     RIGHT FOOT     Past Surgical History  Procedure Laterality Date  . Cesarean section    . Wisdom tooth extraction      Family History  Problem Relation Age of Onset  . Glaucoma Paternal Grandfather   . Glaucoma Paternal Grandmother   . Heart disease Maternal Grandmother   . Asthma Maternal Grandmother   . Anemia Maternal Grandmother   . Hyperlipidemia Father   . Cancer Mother     THYROID AND CERVICAL  . Hypertension Mother     History  Substance Use Topics  . Smoking status: Never Smoker   . Smokeless tobacco: Never Used  .  Alcohol Use: Yes    OB History   Grav Para Term Preterm Abortions TAB SAB Ect Mult Living   3 3  0      3      Review of Systems  Constitutional: Negative for fever and diaphoresis.  HENT: Negative for ear pain, sore throat and neck pain.   Respiratory: Positive for cough, shortness of breath and wheezing. Negative for hemoptysis and sputum production.   Cardiovascular: Negative for chest pain, claudication, syncope and PND.  Gastrointestinal: Negative for vomiting and abdominal pain.  Skin: Negative for rash.  Neurological: Negative for headaches.     Allergies  Other; Percocet; and Prednisone  Home Medications   Current Outpatient Rx  Name  Route  Sig  Dispense  Refill  . albuterol (PROVENTIL HFA;VENTOLIN HFA) 108 (90 BASE) MCG/ACT inhaler   Inhalation   Inhale 3-6 puffs into the lungs every 6 (six) hours as needed. Wheezing or shortness of breath         . cetirizine (ZYRTEC) 10 MG tablet   Oral   Take 10 mg by mouth daily.         Marland Kitchen albuterol (PROVENTIL) (2.5 MG/3ML) 0.083% nebulizer solution   Nebulization   Take 2.5 mg by nebulization every 6 (six) hours as needed. Wheezing or shortness of breath         . Vitamin D, Ergocalciferol, (DRISDOL) 50000 UNITS CAPS      TAKE ONE CAPSULE 1 TIME WEEKLY FOR 12 WEEKS.   20 capsule   0     BP 125/82  Pulse 105  Temp(Src) 97.7 F (36.5 C) (Oral)  Resp 20  SpO2 100%  Physical Exam  Nursing note and vitals reviewed. Constitutional: She is oriented to person, place, and time. She appears well-developed and well-nourished. No distress.  HENT:  Head: Normocephalic and atraumatic.  Eyes: EOM are normal.  Neck: Neck supple. No tracheal deviation present.  Cardiovascular: Normal rate, regular rhythm and normal heart sounds.  Exam reveals no gallop and no friction rub.   No murmur heard. Pulmonary/Chest: Effort normal. No respiratory distress. She has wheezes.  Diffuse inspiratory and expiratory wheezing in all lung fields. Does not appear to be using accessory muscles at this time.   Abdominal: Soft. There is no tenderness.  Musculoskeletal: Normal range of motion.  Neurological: She is alert and oriented to person, place, and time.  Skin: Skin is warm and dry.  Psychiatric: She has a normal mood and affect. Her behavior is normal.    ED Course  Procedures (including critical care time)  DIAGNOSTIC STUDIES: Oxygen Saturation is 100% on room, normal by my interpretation.    COORDINATION OF CARE:   4:20 PM- Treatment plan concerning application of breathing  treatment discussed with patient. Pt agrees with treatment.  5:19 PM- Recheck. Pt is breathing better at this time. Lungs are clear to auscultation. Treatment plan discussed with patient. Pt agrees with treatment.        Labs Reviewed - No data to display No results found.   1. Asthma with exacerbation       MDM  Patient ambulated in ED with O2 saturations maintained >90, no current signs of respiratory distress. Lung exam improved after nebulizer treatment. Decadron given in the ED. Pt states they are breathing at baseline. Pt has been instructed to continue using prescribed medications and to speak with PCP about today's exacerbation.  Patient given rx for both pulmicort and Qvar.  She will only fill  the one she can afford.  Patient wil follow up with her pcp as she has poor control of her symptoms.       I personally performed the services described in this documentation, which was scribed in my presence. The recorded information has been reviewed and is accurate.     Arthor Captain, PA-C 05/15/12 1445

## 2012-05-10 NOTE — ED Notes (Signed)
Pt presents to department for evaluation of SOB. States history of asthma. States she has been using inhaler and taking breathing treatments more than usual. Also states dry cough, headaches and L sided facial twitching. Respirations unlabored. Speaking complete sentences. She is alert and oriented x4. No signs of acute distress noted.

## 2012-05-22 NOTE — ED Provider Notes (Signed)
Medical screening examination/treatment/procedure(s) were performed by non-physician practitioner and as supervising physician I was immediately available for consultation/collaboration.    Vida Roller, MD 05/22/12 1046

## 2012-07-03 ENCOUNTER — Encounter: Payer: Self-pay | Admitting: Pulmonary Disease

## 2012-07-06 ENCOUNTER — Ambulatory Visit (INDEPENDENT_AMBULATORY_CARE_PROVIDER_SITE_OTHER): Payer: 59 | Admitting: Pulmonary Disease

## 2012-07-06 ENCOUNTER — Encounter: Payer: Self-pay | Admitting: Pulmonary Disease

## 2012-07-06 VITALS — BP 118/72 | HR 90 | Temp 98.8°F | Ht 63.0 in | Wt 173.6 lb

## 2012-07-06 DIAGNOSIS — J4530 Mild persistent asthma, uncomplicated: Secondary | ICD-10-CM

## 2012-07-06 DIAGNOSIS — J45909 Unspecified asthma, uncomplicated: Secondary | ICD-10-CM

## 2012-07-06 NOTE — Assessment & Plan Note (Signed)
Stay on symbicort  160 2 puffs twice daily - RINSE mouth after use (MAINTENANCE)  Use albuterol as needed only (RESCUE) Singulair from mar- July , then sep-nov Stay on ZYRTEC daily Call us if you have a flare up of symptoms It remains well controlled will step down to 80 mcg in 3 months

## 2012-07-06 NOTE — Patient Instructions (Addendum)
Stay on symbicort 2 puffs twice daily - RINSE mouth after use (MAINTENANCE)  Use albuterol as needed only (RESCUE) Singulair from mar- July , then sep-nov Stay on ZYRTEC daily Call us if you have a flare up of symptoms

## 2012-07-06 NOTE — Progress Notes (Signed)
Subjective:    Patient ID: Madeline Gomez, female    DOB: [birth year], 32 y.o.   MRN: [id number]  HPI PCP- harvette Lovell Sheehan  32 year old never smoker presents for evaluation of asthma. Her symptoms first started around her first pregnancy in 2003 when she lived in [place]. She reports mild persistent symptoms since then, has been maintained on Advair for the past 3 years. She moved to [place] about 7 years ago and states that her asthma has been worse since then likely due to allergies. Her triggers include allergies and odors. Her symptoms have been worse in the last year with 2 year visits in November 2013 in April 2014. She reports an allergy to prednisone, in the form of breaking out and insomnia, which may have been related to taking increased dosage- is doing here visit she was only given a Solu-Medrol shot. She admits to using albuterol nebs from her children. In the last month she was started on Symbicort 160 mcg 2 puffs twice daily, she she remembers to take this about once daily but is much improved. She denies nocturnal wheezing and needs her albuterol rescue inhaler about once every 2 days. 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 She has 2 dogs at home and denies any environmental triggers. Vitamin D deficiency is being treated. She reports some facial twitching which has been attributed to increased albuterol usage, much better now since starting Symbicort. She is also maintained on clonazepam for anxiety but does not use this much due to somnolence. I also note trial of BuSpar in the past Spirometry showed FEv1 77%, FVC 101%, ratio 65 s/o mild obstruction  Past Medical History  Diagnosis Date  . H/O vitamin D deficiency   . Anemia   . Asthma   . BV (bacterial vaginosis)     RECURRENT  . Broken bones     RIGHT FOOT   . Thyroid disease     Past Surgical History  Procedure Laterality Date  . Cesarean section    . Wisdom  tooth extraction      Allergies  Allergen Reactions  . Other     PT IS ALLERGIC TO GRASS, CATS, TOMATOES, PINEAPPLES, POLLEN  . Percocet (Oxycodone-Acetaminophen) Itching  . Prednisone Itching    Rapid heart rate, made me feel bad    History   Social History  . Marital Status: Single    Spouse Name: N/A    Number of Children: N/A  . Years of Education: N/A   Occupational History  . Not on file.   Social History Main Topics  . Smoking status: Never Smoker   . Smokeless tobacco: Never Used  . Alcohol Use: No  . Drug Use: No  . Sexually Active: Not Currently    Birth Control/ Protection: IUD     Comment: MIRENA   Other Topics Concern  . Not on file   Social History Narrative  . No narrative on file    Ms. Veloso had no medications administered during this visit.   Review of Systems  Constitutional: Positive for appetite change and unexpected weight change.  HENT: Positive for congestion, sore throat, sneezing and dental problem. Negative for ear pain.   Respiratory: Positive for cough and shortness of breath.   Cardiovascular: Positive for chest pain. Negative for palpitations and leg swelling.  Gastrointestinal: Negative for abdominal pain.  Musculoskeletal: Negative for joint swelling.  Skin: Negative for rash.  Neurological: Negative for headaches.  Psychiatric/Behavioral: The patient is nervous/anxious.        Objective:   Physical Exam   Gen. Pleasant, well-nourished, in no distress, normal affect ENT - no lesions, no post nasal drip, enlarged turbinates Neck: No JVD, no thyromegaly, no carotid bruits Lungs: no use of accessory muscles, no dullness to percussion, clear without rales or rhonchi  Cardiovascular: Rhythm regular, heart sounds  normal, no murmurs or gallops, no peripheral edema Abdomen: soft and non-tender, no hepatosplenomegaly, BS normal. Musculoskeletal: No deformities, no cyanosis or clubbing Neuro:  alert, non focal         Assessment & Plan:

## 2012-07-15 ENCOUNTER — Ambulatory Visit
Admission: RE | Admit: 2012-07-15 | Discharge: 2012-07-15 | Disposition: A | Discharge status: Home / Self Care | Payer: 59 | Source: Ambulatory Visit | Attending: Internal Medicine | Admitting: Internal Medicine

## 2012-07-15 ENCOUNTER — Other Ambulatory Visit: Payer: Self-pay | Admitting: Internal Medicine

## 2012-07-15 DIAGNOSIS — E0590 Thyrotoxicosis, unspecified without thyrotoxic crisis or storm: Secondary | ICD-10-CM

## 2012-09-19 ENCOUNTER — Emergency Department (HOSPITAL_COMMUNITY): Payer: 59

## 2012-09-19 ENCOUNTER — Emergency Department (HOSPITAL_COMMUNITY)
Admission: EM | Admit: 2012-09-19 | Discharge: 2012-09-19 | Disposition: A | Discharge status: Home / Self Care | Payer: 59 | Attending: Emergency Medicine | Admitting: Emergency Medicine

## 2012-09-19 ENCOUNTER — Encounter (HOSPITAL_COMMUNITY): Payer: Self-pay | Admitting: *Deleted

## 2012-09-19 DIAGNOSIS — J45909 Unspecified asthma, uncomplicated: Secondary | ICD-10-CM | POA: Insufficient documentation

## 2012-09-19 DIAGNOSIS — Z862 Personal history of diseases of the blood and blood-forming organs and certain disorders involving the immune mechanism: Secondary | ICD-10-CM | POA: Insufficient documentation

## 2012-09-19 DIAGNOSIS — Z79899 Other long term (current) drug therapy: Secondary | ICD-10-CM | POA: Insufficient documentation

## 2012-09-19 DIAGNOSIS — Z3202 Encounter for pregnancy test, result negative: Secondary | ICD-10-CM | POA: Insufficient documentation

## 2012-09-19 DIAGNOSIS — Z8742 Personal history of other diseases of the female genital tract: Secondary | ICD-10-CM | POA: Insufficient documentation

## 2012-09-19 DIAGNOSIS — E559 Vitamin D deficiency, unspecified: Secondary | ICD-10-CM | POA: Insufficient documentation

## 2012-09-19 DIAGNOSIS — Z8639 Personal history of other endocrine, nutritional and metabolic disease: Secondary | ICD-10-CM | POA: Insufficient documentation

## 2012-09-19 DIAGNOSIS — R110 Nausea: Secondary | ICD-10-CM | POA: Insufficient documentation

## 2012-09-19 DIAGNOSIS — R079 Chest pain, unspecified: Secondary | ICD-10-CM

## 2012-09-19 DIAGNOSIS — R0789 Other chest pain: Secondary | ICD-10-CM | POA: Insufficient documentation

## 2012-09-19 LAB — POCT PREGNANCY, URINE: Preg Test, Ur: NEGATIVE

## 2012-09-19 MED ORDER — ONDANSETRON HCL 8 MG PO TABS: 8.0000 mg | ORAL_TABLET | Freq: Three times a day (TID) | ORAL | Status: DC | PRN

## 2012-09-19 NOTE — ED Notes (Signed)
Pt complaining of left sided chest pain for 1 week. Pain is intermittent. No nausea or vomiting. No diaphoresis. Pt stated that she has been under stress and also because of the weather her asthma has been causing her issues. SOB intermittently. No cardiac or respiratory distress at this timr. Will continue to monitor.

## 2012-09-19 NOTE — ED Notes (Signed)
The pt is c/o mid-chest pain for 3-4 days.  She had thyroid  Labs drawn in June and had not been able to get her results after making numerus call to her doctor.  She has hoarseness each am.  She is concerned because mother had had thyroid cancer.  She has been taking klonopin

## 2012-09-19 NOTE — ED Provider Notes (Signed)
CSN: [id number]     Arrival date & time 09/19/12  1839 History     First MD Initiated Contact with Patient 09/19/12 2015     Chief Complaint  Patient presents with  . Chest Pain   (Consider location/radiation/quality/duration/timing/severity/associated sxs/prior Treatment) HPI History provided by pt.   Pt a poor historian.  Presents w/ intermittent, mid-line and left chest pressure for the past week.  Has not recognized any aggravating/alleviating factors; episodes seem to be random and pain resolves w/in a few minutes of onset. No associated sx.  Had shooting pains down her left arm while making dinner a few nights ago, but does not recall whether or not she was experiencing CP simultaneously.  Has not had fever, cough, SOB, abd pain.  Has chronic, intermittent nausea that she attributes to probiotics, but this is stable.  Does not smoke cigarettes. PMH sig for asthma and anxiety.  Panic attacks do not usually present w/ CP.  No immediate FH ACS.   No recent trauma or heavy lifting.  Pt has been stressed out recently because diagnosed w/ hyperthyroidism and thyroid nodule, and mother has h/o thyroid cancer.  She works 80hrs/wk and has 3 children. Past Medical History  Diagnosis Date  . H/O vitamin D deficiency   . Anemia   . Asthma   . BV (bacterial vaginosis)     RECURRENT  . Broken bones     RIGHT FOOT   . Thyroid disease    Past Surgical History  Procedure Laterality Date  . Cesarean section    . Wisdom tooth extraction     Family History  Problem Relation Age of Onset  . Glaucoma Paternal Grandfather   . Glaucoma Paternal Grandmother   . Heart disease Maternal Grandmother   . Asthma Maternal Grandmother   . Anemia Maternal Grandmother   . Hyperlipidemia Father   . Cancer Mother     THYROID AND CERVICAL  . Hypertension Mother    History  Substance Use Topics  . Smoking status: Never Smoker   . Smokeless tobacco: Never Used  . Alcohol Use: No   OB History   Grav  Para Term Preterm Abortions TAB SAB Ect Mult Living   3 3  0      3     Review of Systems  All other systems reviewed and are negative.    Allergies  Other; Percocet; and Prednisone  Home Medications   Current Outpatient Rx  Name  Route  Sig  Dispense  Refill  . albuterol (PROVENTIL HFA;VENTOLIN HFA) 108 (90 BASE) MCG/ACT inhaler   Inhalation   Inhale 2 puffs into the lungs every 4 (four) hours as needed for wheezing.   1 Inhaler   3   . budesonide-formoterol (SYMBICORT) 160-4.5 MCG/ACT inhaler   Inhalation   Inhale 2 puffs into the lungs 2 (two) times daily.         . cetirizine (ZYRTEC) 10 MG tablet   Oral   Take 10 mg by mouth daily.         . clonazePAM (KLONOPIN) 1 MG tablet   Oral   Take 1 mg by mouth at bedtime as needed for anxiety. As needed         . Vitamin D, Ergocalciferol, (DRISDOL) 50000 UNITS CAPS      TAKE ONE CAPSULE 1 TIME WEEKLY FOR 12 WEEKS.   20 capsule   0    BP 124/83  Pulse 98  Temp(Src) 98.6 F (37 C) (  Oral)  Resp 22  SpO2 100% Physical Exam  Nursing note and vitals reviewed. Constitutional: She is oriented to person, place, and time. She appears well-developed and well-nourished. No distress.  HENT:  Head: Normocephalic and atraumatic.  Eyes:  Normal appearance  Neck: Normal range of motion.  Cardiovascular: Normal rate, regular rhythm and intact distal pulses.   Pulmonary/Chest: Effort normal and breath sounds normal. No respiratory distress. She exhibits no tenderness.  No pleuritic pain reported  Abdominal: Soft. Bowel sounds are normal. She exhibits no distension. There is no tenderness. There is no guarding.  Musculoskeletal: Normal range of motion.  No peripheral edema or calf tenderness  Neurological: She is alert and oriented to person, place, and time.  Skin: Skin is warm and dry. No rash noted.  Psychiatric: She has a normal mood and affect. Her behavior is normal.    ED Course   Procedures (including  critical care time)  Labs Reviewed - No data to display Dg Chest 2 View  09/19/2012   *RADIOLOGY REPORT*  Clinical Data: Chest pain and shortness of breath  CHEST - 2 VIEW  Comparison:  January 20, 2010  Findings: There is no edema or consolidation.  The heart size and pulmonary vascularity are normal.  No adenopathy.  No pneumothorax. No bone lesions.  IMPRESSION: No abnormality noted.   Original Report Authenticated By: Bretta Bang, M.D.   1. Chest pain     MDM  32yo F w/ asthma and anxiety, recently diagnosed w/ thyroid nodule and hyperthyroidism but treatment not yet initiated, presents w/ intermittent CP x 1 week.  Doubt ACS; pain atypical, no risk factors, EKG non-ischemic.  Characteristics of pain are not consistent w/ nor are there any signs of PE on exam.  CXR pending.  Urine preg ordered as well because pt reports that despite being hyperthyroid, she seems to be gaining weight and craving sweets and has intermittent nausea.  8:59 PM   CXR unremarkable and urine hcg neg.   All results discussed w/ patient.  Recommended f/u with her PCP.  Return precautions discussed.   Otilio Miu, PA-C 09/20/12 [phone]

## 2012-09-20 NOTE — ED Provider Notes (Signed)
Medical screening examination/treatment/procedure(s) were performed by non-physician practitioner and as supervising physician I was immediately available for consultation/collaboration.   Cliford Sequeira Y. Bronc Brosseau, MD 09/20/12 1834 

## 2012-10-12 ENCOUNTER — Ambulatory Visit (INDEPENDENT_AMBULATORY_CARE_PROVIDER_SITE_OTHER): Payer: 59 | Admitting: Adult Health

## 2012-10-12 ENCOUNTER — Encounter: Payer: Self-pay | Admitting: Adult Health

## 2012-10-12 VITALS — BP 132/74 | HR 77 | Temp 98.3°F | Ht 63.25 in | Wt 184.6 lb

## 2012-10-12 DIAGNOSIS — J45909 Unspecified asthma, uncomplicated: Secondary | ICD-10-CM

## 2012-10-12 NOTE — Assessment & Plan Note (Signed)
Good control  Declined flu shot   Plan  Stay on symbicort 2 puffs twice daily - RINSE mouth after use (MAINTENANCE)  Use albuterol as needed only (RESCUE) May start Singulair from mar- July , then sep-nov if needed for allergy flare  Stay on ZYRTEC daily Call us if you have a flare up of symptoms follow up Dr. Vassie Loll  In 4 months and As needed

## 2012-10-12 NOTE — Patient Instructions (Addendum)
Stay on symbicort 2 puffs twice daily - RINSE mouth after use (MAINTENANCE)  Use albuterol as needed only (RESCUE) May start Singulair from mar- July , then sep-nov if needed for allergy flare  Stay on ZYRTEC daily Call us if you have a flare up of symptoms follow up Dr. Vassie Loll  In 4 months and As needed

## 2012-10-12 NOTE — Progress Notes (Signed)
Subjective:    Patient ID: Madeline Gomez, female    DOB: [birth year], 32 y.o.   MRN: [id number]  HPI 32 year old never smoker presents for evaluation of asthma. Her symptoms first started around her first pregnancy in 2003 when she lived in [place]. She reports mild persistent symptoms since then, has been maintained on Advair for the past 3 years. She moved to [place] about 7 years ago and states that her asthma has been worse since then likely due to allergies. Her triggers include allergies and odors. Her symptoms have been worse in the last year with 2 year visits in November 2013 in April 2014. She reports an allergy to prednisone, in the form of breaking out and insomnia, which may have been related to taking increased dosage- is doing here visit she was only given a Solu-Medrol shot. She admits to using albuterol nebs from her children. In the last month she was started on Symbicort 160 mcg 2 puffs twice daily, she she remembers to take this about once daily but is much improved. She denies nocturnal wheezing and needs her albuterol rescue inhaler about once every 2 days. 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 She has 2 dogs at home and denies any environmental triggers. Vitamin D deficiency is being treated. She reports some facial twitching which has been attributed to increased albuterol usage, much better now since starting Symbicort. She is also maintained on clonazepam for anxiety but does not use this much due to somnolence. I also note trial of BuSpar in the past Spirometry showed FEv1 77%, FVC 101%, ratio 65 s/o mild obstruction >rec symbicort   10/12/2012 Follow up  3 month follow up asthma - reports doing well overall.  had some mild wheezing, but this is resolved. She has rare albuterol use. She's had no emergency room visits for her asthma. Since her last visit. She continues to use Symbicort 2 puff daily She denies any  hemoptysis, chest pain, orthopnea, PND, or leg swelling     Review of Systems Constitutional:   No  weight loss, night sweats,  Fevers, chills, fatigue, or  lassitude.  HEENT:   No headaches,  Difficulty swallowing,  Tooth/dental problems, or  Sore throat,                No sneezing, itching, ear ache, nasal congestion, post nasal drip,   CV:  No chest pain,  Orthopnea, PND, swelling in lower extremities, anasarca, dizziness, palpitations, syncope.   GI  No heartburn, indigestion, abdominal pain, nausea, vomiting, diarrhea, change in bowel habits, loss of appetite, bloody stools.   Resp: No shortness of breath with exertion or at rest.  No excess mucus, no productive cough,  No non-productive cough,  No coughing up of blood.  No change in color of mucus.  No wheezing.  No chest wall deformity  Skin: no rash or lesions.  GU: no dysuria, change in color of urine, no urgency or frequency.  No flank pain, no hematuria   MS:  No joint pain or swelling.  No decreased range of motion.  No back pain.  Psych:  No change in mood or affect. No depression or anxiety.  No memory loss.         Objective:   Physical Exam GEN: A/Ox3; pleasant , NAD, well nourished   HEENT:  [place]/AT,  EACs-clear, TMs-wnl, NOSE-clear, THROAT-clear, no lesions, no postnasal drip or exudate noted.   NECK:  Supple w/ fair ROM; no JVD; normal carotid impulses w/o bruits; no thyromegaly or nodules palpated; no lymphadenopathy.  RESP  Clear  P & A; w/o, wheezes/ rales/ or rhonchi.no accessory muscle use, no dullness to percussion  CARD:  RRR, no m/r/g  , no peripheral edema, pulses intact, no cyanosis or clubbing.  GI:   Soft & nt; nml bowel sounds; no organomegaly or masses detected.  Musco: Warm bil, no deformities or joint swelling noted.   Neuro: alert, no focal deficits noted.    Skin: Warm, no lesions or rashes         Assessment & Plan:

## 2012-11-06 ENCOUNTER — Other Ambulatory Visit (HOSPITAL_COMMUNITY): Payer: Self-pay | Admitting: Internal Medicine

## 2012-11-06 DIAGNOSIS — E0520 Thyrotoxicosis with toxic multinodular goiter without thyrotoxic crisis or storm: Secondary | ICD-10-CM

## 2012-11-16 ENCOUNTER — Encounter (HOSPITAL_COMMUNITY)
Admission: RE | Admit: 2012-11-16 | Discharge: 2012-11-16 | Disposition: A | Discharge status: Home / Self Care | Payer: 59 | Source: Ambulatory Visit | Attending: Internal Medicine | Admitting: Internal Medicine

## 2012-11-17 ENCOUNTER — Encounter (HOSPITAL_COMMUNITY): Payer: 59

## 2012-11-26 ENCOUNTER — Encounter (HOSPITAL_COMMUNITY)
Admission: RE | Admit: 2012-11-26 | Discharge: 2012-11-26 | Disposition: A | Discharge status: Home / Self Care | Payer: 59 | Source: Ambulatory Visit | Attending: Internal Medicine | Admitting: Internal Medicine

## 2012-11-26 DIAGNOSIS — E0520 Thyrotoxicosis with toxic multinodular goiter without thyrotoxic crisis or storm: Secondary | ICD-10-CM

## 2012-11-27 ENCOUNTER — Encounter (HOSPITAL_COMMUNITY)
Admission: RE | Admit: 2012-11-27 | Discharge: 2012-11-27 | Disposition: A | Discharge status: Home / Self Care | Payer: 59 | Source: Ambulatory Visit | Attending: Internal Medicine | Admitting: Internal Medicine

## 2012-11-27 MED ORDER — SODIUM IODIDE I 131 CAPSULE
14.8000 | Freq: Once | INTRAVENOUS | Status: AC | PRN
  Administered 2012-11-27: 14.8 via ORAL

## 2012-11-27 MED ORDER — SODIUM PERTECHNETATE TC 99M INJECTION
10.0000 | Freq: Once | INTRAVENOUS | Status: AC | PRN
  Administered 2012-11-27: 10 via INTRAVENOUS

## 2012-12-10 ENCOUNTER — Other Ambulatory Visit: Payer: Self-pay

## 2012-12-15 ENCOUNTER — Other Ambulatory Visit: Payer: Self-pay | Admitting: Internal Medicine

## 2012-12-15 DIAGNOSIS — E041 Nontoxic single thyroid nodule: Secondary | ICD-10-CM

## 2012-12-22 ENCOUNTER — Ambulatory Visit
Admission: RE | Admit: 2012-12-22 | Discharge: 2012-12-22 | Disposition: A | Discharge status: Home / Self Care | Payer: 59 | Source: Ambulatory Visit | Attending: Internal Medicine | Admitting: Internal Medicine

## 2012-12-22 ENCOUNTER — Other Ambulatory Visit (HOSPITAL_COMMUNITY)
Admission: RE | Admit: 2012-12-22 | Discharge: 2012-12-22 | Disposition: A | Discharge status: Home / Self Care | Payer: 59 | Source: Ambulatory Visit | Attending: Interventional Radiology | Admitting: Interventional Radiology

## 2012-12-22 DIAGNOSIS — E041 Nontoxic single thyroid nodule: Secondary | ICD-10-CM

## 2013-01-19 ENCOUNTER — Telehealth: Payer: Self-pay | Admitting: Pulmonary Disease

## 2013-01-19 NOTE — Telephone Encounter (Signed)
Called patient to schd a recall apt x3. No return call back. sent letter 01/19/13 °

## 2013-08-20 ENCOUNTER — Telehealth: Payer: Self-pay | Admitting: Adult Health

## 2013-08-23 NOTE — Telephone Encounter (Signed)
Shanda BumpsJessica please advise of these forms.  thanks

## 2013-08-25 NOTE — Telephone Encounter (Signed)
ATC pt - line is not a working number I just returned to the office today and TP is off the rest of the week  On pt's FMLA forms TP filled out a couple of weeks ago, #7 is answered "unknown".  Pt's employer is requesting the "exact number of times pt can be expected to be incapacitated" for the diagnosis > Asthma.  Pt last saw TP 10/2012 and saw RA 07/2012 Forms given to Mindy to check if RA wouldn't mind answering that question since TP is unavailable  OV notes printed for RA to view

## 2013-08-25 NOTE — Telephone Encounter (Signed)
Done- [facility]

## 2013-08-25 NOTE — Telephone Encounter (Signed)
Form filled out by RA. Pt needs OV per RA form faxed to # on form and # for pt is non working #  Called alternate # in pt chart: Robinson,Marcus Significant other [phone]--LMTCB x1

## 2013-08-26 ENCOUNTER — Encounter: Payer: Self-pay | Admitting: *Deleted

## 2013-08-26 NOTE — Telephone Encounter (Signed)
I have mailed this back to pt and also sent her a letter reminding her she is due for appt.  Nothing further needed

## 2013-08-27 ENCOUNTER — Telehealth: Payer: Self-pay | Admitting: Pulmonary Disease

## 2013-08-27 NOTE — Telephone Encounter (Signed)
The question that needed to be answered was done by Dr. Vassie LollAlva What is not correct? LMTCB x1 for pt

## 2013-08-30 ENCOUNTER — Ambulatory Visit (INDEPENDENT_AMBULATORY_CARE_PROVIDER_SITE_OTHER): Payer: 59 | Admitting: Pulmonary Disease

## 2013-08-30 ENCOUNTER — Encounter: Payer: Self-pay | Admitting: Pulmonary Disease

## 2013-08-30 VITALS — BP 124/74 | HR 85 | Ht 63.25 in | Wt 193.0 lb

## 2013-08-30 DIAGNOSIS — J45909 Unspecified asthma, uncomplicated: Secondary | ICD-10-CM

## 2013-08-30 NOTE — Telephone Encounter (Signed)
Pt coming in for appt today with RA.

## 2013-08-30 NOTE — Patient Instructions (Signed)
Please take symbicort 160 daily - this is your MAINTENANCE medication Take albuterol as needed only- this is for RESCUE Take singulair 10 daily (for allergies as well as asthma) FMLA filled out

## 2013-08-30 NOTE — Assessment & Plan Note (Signed)
Please take symbicort 160 daily - this is your MAINTENANCE medication Take albuterol as needed only- this is for RESCUE Take singulair 10 daily (for allergies as well as asthma) FMLA filled out : 2-4 flares/ year

## 2013-08-30 NOTE — Progress Notes (Signed)
   Subjective:    Patient ID: Madeline Gomez, female    DOB: 01/27/1981, 33 y.o.   MRN: [id number]  HPI  33 year old never smoker presents for FU of asthma.  Her symptoms first started around her first pregnancy in 2003 when she lived in [place]. She reports mild persistent symptoms since then. [place] moved to [place] about 2006 and states that her asthma has been worse since then likely due to allergies. Her triggers include allergies and odors. Her symptoms have been worse in the last year with 2 year visits in November 2013 in April 2014. She reports an allergy to prednisone, in the form of breaking out and insomnia, which may have been related to taking increased dosage-  She admits to using albuterol nebs from her children.  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 She has 2 dogs at home and denies any environmental triggers.  She is also maintained on clonazepam for anxiety but does not use this much due to somnolence. I also note trial of BuSpar in the past  Spirometry showed FEv1 77%, FVC 101%, ratio 65 s/o mild obstruction  >rec symbicort   08/30/2013 9 month follow up asthma - reports doing well overall. Needs FMLA filled out - I put down 1-2 exacerbations /yr but seems like she is also seeing allergist Symbicort expensive -hence using son's in the summer She has daily albuterol use.  She's had no emergency room visits for her asthma. Since her last visit.  She denies any hemoptysis, chest pain, orthopnea, PND, or leg swelling Spirometry >>moderate airway obstruction with FEV1 65%, FVC 86% and ratio 65    Review of Systems neg for any significant sore throat, dysphagia, itching, sneezing, nasal congestion or excess/ purulent secretions, fever, chills, sweats, unintended wt loss, pleuritic or exertional cp, hempoptysis, orthopnea pnd or change in chronic leg swelling. Also denies presyncope, palpitations, heartburn, abdominal  pain, nausea, vomiting, diarrhea or change in bowel or urinary habits, dysuria,hematuria, rash, arthralgias, visual complaints, headache, numbness weakness or ataxia.     Objective:   Physical Exam  Gen. Pleasant, well-nourished, in no distress ENT - no lesions, no post nasal drip Neck: No JVD, no thyromegaly, no carotid bruits Lungs: no use of accessory muscles, no dullness to percussion, clear without rales or rhonchi  Cardiovascular: Rhythm regular, heart sounds  normal, no murmurs or gallops, no peripheral edema Musculoskeletal: No deformities, no cyanosis or clubbing         Assessment & Plan:

## 2013-09-01 ENCOUNTER — Other Ambulatory Visit: Payer: Self-pay | Admitting: Internal Medicine

## 2013-09-01 DIAGNOSIS — E042 Nontoxic multinodular goiter: Secondary | ICD-10-CM

## 2013-09-06 ENCOUNTER — Other Ambulatory Visit: Payer: 59

## 2013-11-19 ENCOUNTER — Other Ambulatory Visit: Payer: Self-pay

## 2013-12-06 ENCOUNTER — Encounter: Payer: Self-pay | Admitting: Pulmonary Disease

## 2014-02-22 IMAGING — US US SOFT TISSUE HEAD/NECK
1 series · 14 of 25 positions shown · non-contrast
Comparison: None.

CLINICAL DATA: Hyperthyroidism.

THYROID ULTRASOUND
TECHNIQUE: Ultrasound examination of the thyroid gland and adjacent
soft tissues was performed.

[Series 1: us soft tissue head/neck · 0.07mm/px · 14 of 53 slices shown]
[im 1/53]
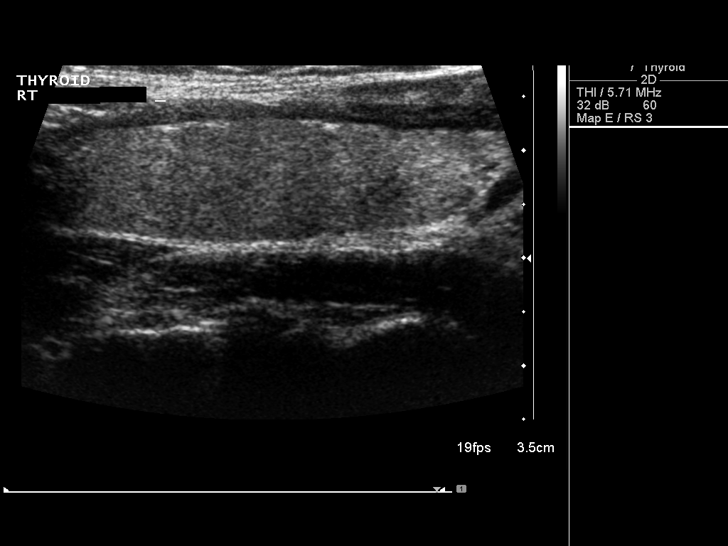
[im 5/53]
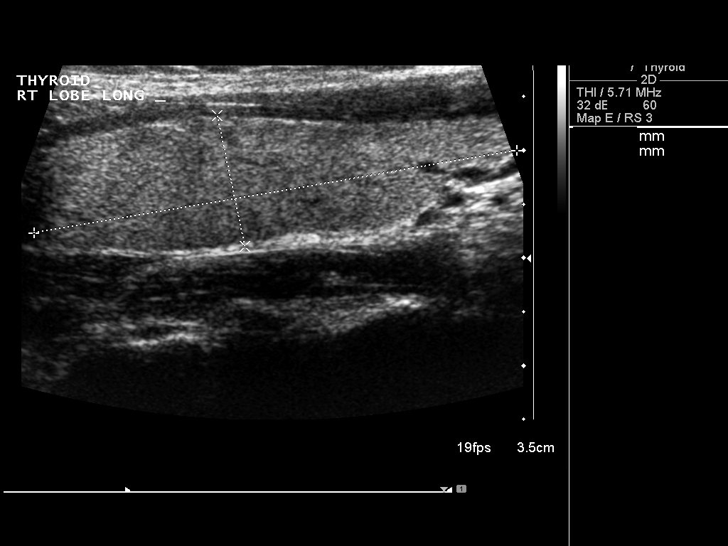
[im 9/53]
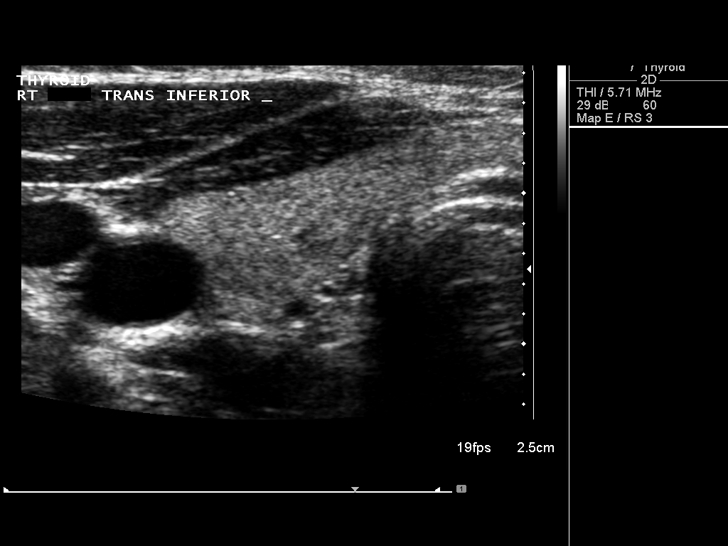
[im 14/53]
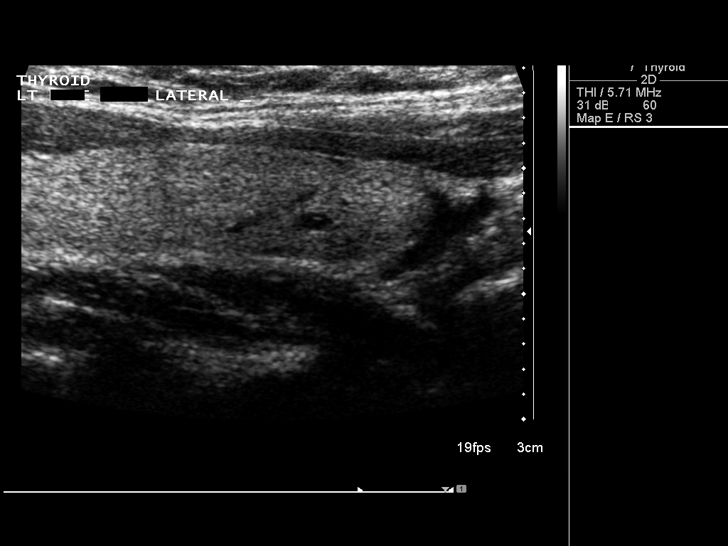
[im 18/53]
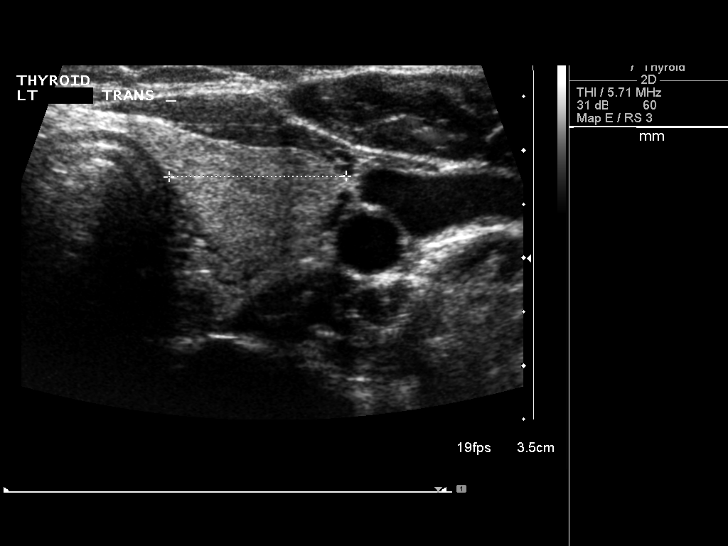
[im 20/53]
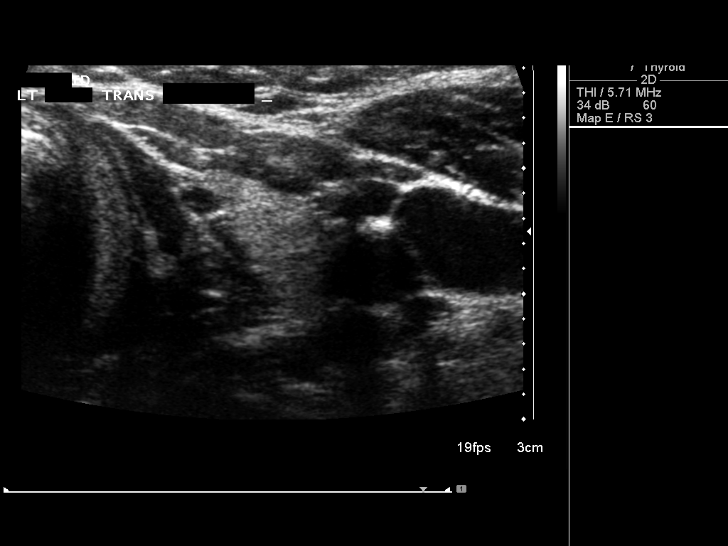
[im 24/53]
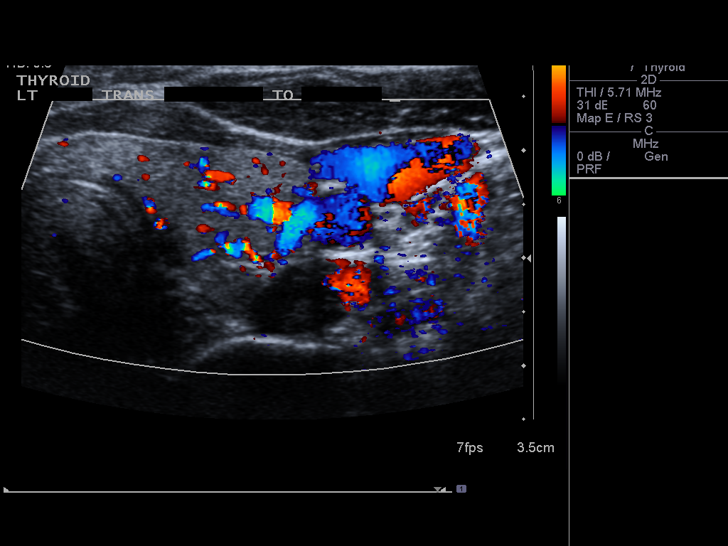
[im 29/53]
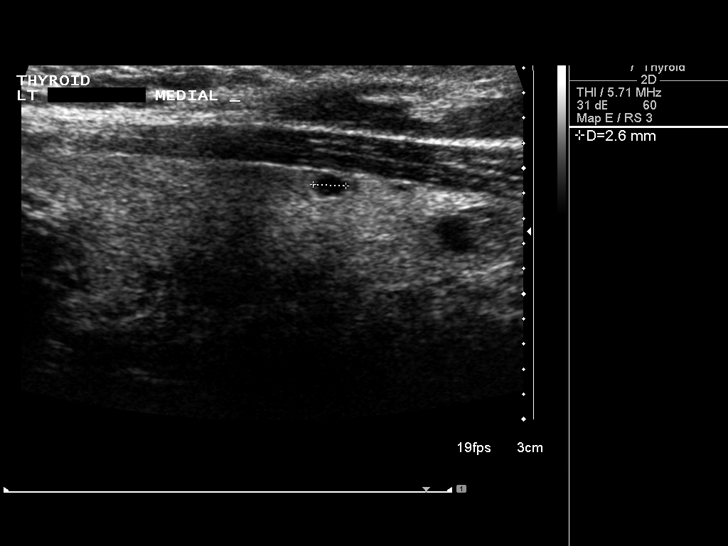
[im 33/53]
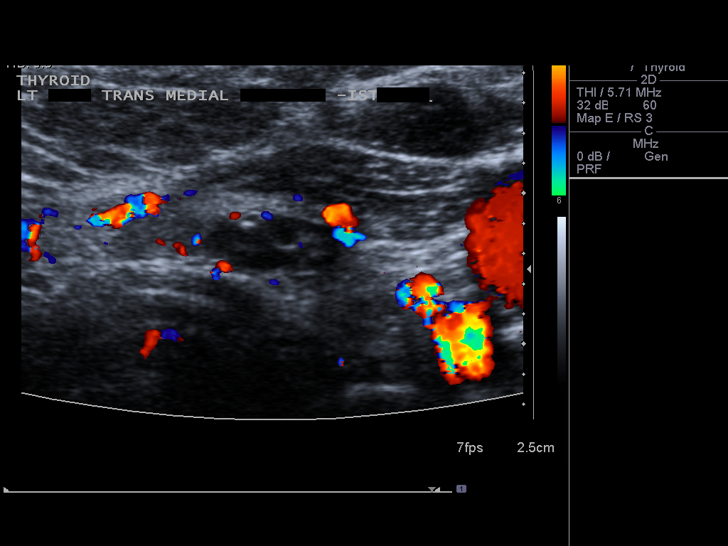
[im 35/53]
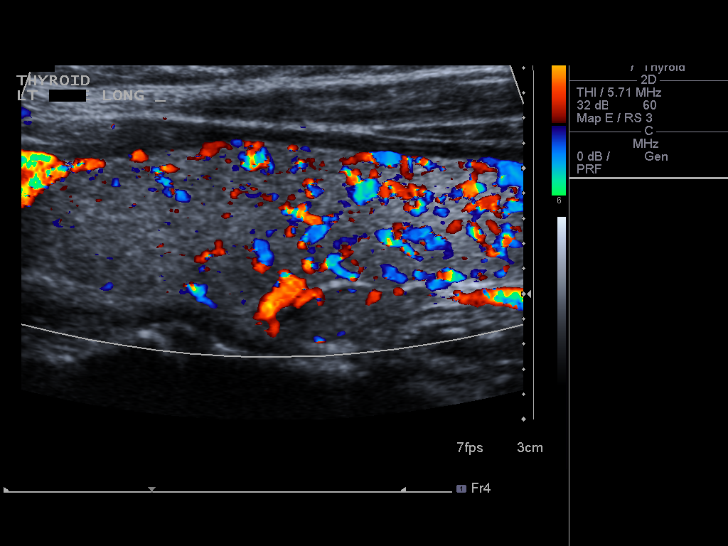
[im 40/53]
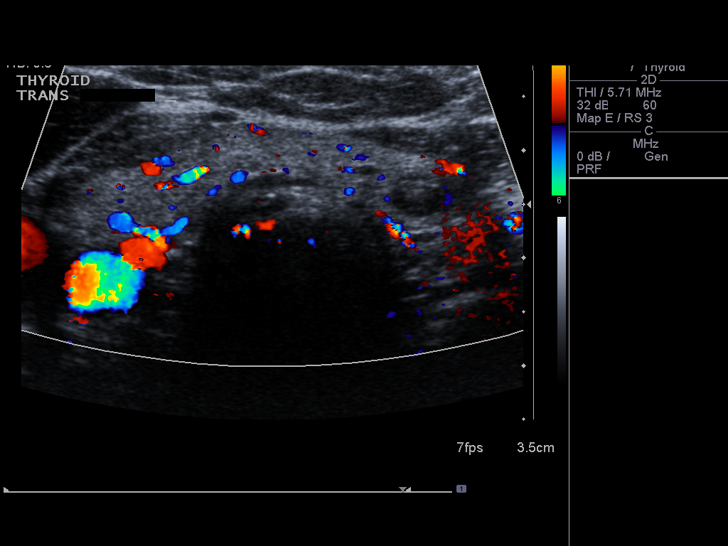
[im 44/53]
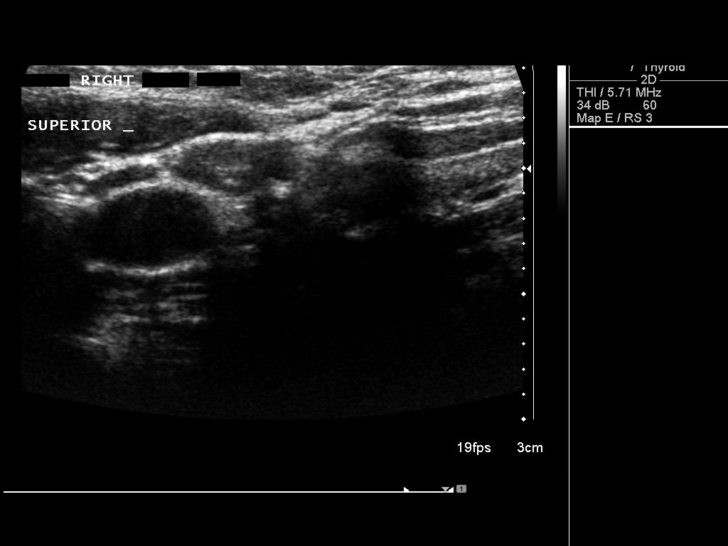
[im 48/53]
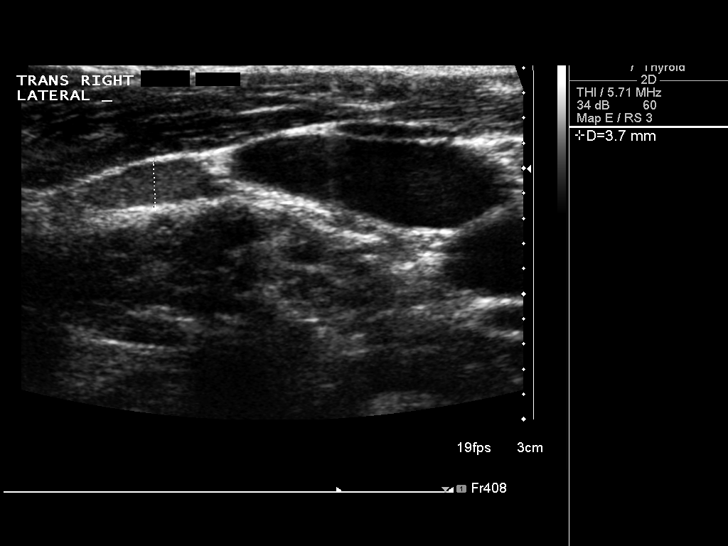
[im 53/53]
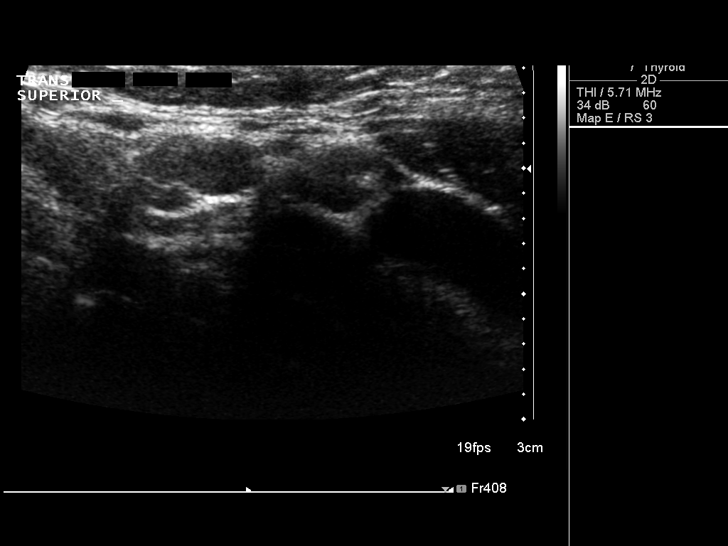

[14 of 25 positions shown; findings below may reference images not displayed]

FINDINGS: Right thyroid lobe:  Measures 4.6 x 1.2 x 2.1 cm.
Left thyroid lobe:  Measures 5.0 x 1.2 x 1.6 cm
Isthmus:  Measures 0.4 cm.

Focal nodules:  There is an oval shaped heterogeneous hypoechoic
lesion along the inferior left thyroid lobe.  This lesion measures
at 0.7 x 0.4 x 0.8 cm.  There is also a 0.3 cm hypoechoic structure
in the lower left thyroid lobe which probably represents a cyst.

Lymphadenopathy:  Small lymph nodes bilaterally.
IMPRESSION: Two small nodules in the left thyroid lobe.  The largest measures
up to 0.8 cm.

## 2014-05-02 ENCOUNTER — Encounter: Payer: Self-pay | Admitting: Pulmonary Disease

## 2014-05-02 ENCOUNTER — Ambulatory Visit (INDEPENDENT_AMBULATORY_CARE_PROVIDER_SITE_OTHER): Payer: 59 | Admitting: Pulmonary Disease

## 2014-05-02 VITALS — BP 130/84 | HR 77 | Temp 97.6°F | Ht 63.75 in | Wt 185.2 lb

## 2014-05-02 DIAGNOSIS — J45901 Unspecified asthma with (acute) exacerbation: Secondary | ICD-10-CM | POA: Insufficient documentation

## 2014-05-02 DIAGNOSIS — J4541 Moderate persistent asthma with (acute) exacerbation: Secondary | ICD-10-CM | POA: Diagnosis not present

## 2014-05-02 MED ORDER — METHYLPREDNISOLONE 8 MG PO TABS: ORAL_TABLET | ORAL | Status: DC

## 2014-05-02 MED ORDER — BUDESONIDE-FORMOTEROL FUMARATE 160-4.5 MCG/ACT IN AERO: 2.0000 | INHALATION_SPRAY | Freq: Two times a day (BID) | RESPIRATORY_TRACT | Status: DC

## 2014-05-02 NOTE — Progress Notes (Signed)
Reviewed & agree with plan  

## 2014-05-02 NOTE — Patient Instructions (Signed)
Get back on symbicort 2 puffs am AND pm everyday.  Will give you samples, and also patient assistance paperwork . Will treat with medrol, 32mg  a day for 2 days, then 16mg  a day for 2 days, then 8 mg a day for 2 days, then stop Keep followup with Dr. Vassie LollAlva, but call if you are not getting better.

## 2014-05-02 NOTE — Assessment & Plan Note (Signed)
The patient is having an acute exacerbation of her known asthma, and unfortunately she has not been able to stay on her maintenance medications because of financial reasons. She has a reasonable co-pay, but simply cannot afford it. I have stressed to her again the importance of staying on inhaled corticosteroids, and will give her samples of the Symbicort as well as the patient assistance paperwork. Give her a short course of steroids to help get her closer to her usual baseline. She is unable to take prednisone because of side effects, and therefore will try her on Medrol.

## 2014-05-02 NOTE — Progress Notes (Signed)
   Subjective:    Patient ID: Madeline Gomez, female    DOB: Mar 20, 1980, 34 y.o.   MRN: [id number]  HPI The patient comes in today for an acute sick visit. She has known asthma, but has been noncompliant with maintenance therapy. This is primarily because of financial reasons, and cannot afford her co-pay for Symbicort.  She has been out of her medications for the last 2 months, and has had increasing chest tightness, wheezing, cough, and increasing shortness of breath. She denies any significant chest congestion or purulent mucus.   Review of Systems  Constitutional: Positive for chills. Negative for fever and unexpected weight change.  HENT: Positive for congestion and postnasal drip. Negative for dental problem, ear pain, nosebleeds, rhinorrhea, sinus pressure, sneezing, sore throat and trouble swallowing.   Eyes: Negative for redness and itching.  Respiratory: Positive for cough, chest tightness, shortness of breath and wheezing.   Cardiovascular: Negative for palpitations and leg swelling.  Gastrointestinal: Negative for nausea and vomiting.  Genitourinary: Negative for dysuria.  Musculoskeletal: Negative for joint swelling.  Skin: Negative for rash.  Neurological: Negative for headaches.  Hematological: Does not bruise/bleed easily.  Psychiatric/Behavioral: Negative for dysphoric mood. The patient is not nervous/anxious.        Objective:   Physical Exam Overweight female in no acute distress Nose without purulence or discharge noted Neck without lymphadenopathy or thyromegaly Chest with decreased breath sounds, a few rhonchi, no active wheezing Cardiac exam with regular rate and rhythm Extremities without edema, no cyanosis Alert and oriented, moves all 4 extremities.       Assessment & Plan:

## 2014-05-13 ENCOUNTER — Telehealth: Payer: Self-pay | Admitting: Pulmonary Disease

## 2014-05-13 NOTE — Telephone Encounter (Signed)
Forms placed in RA box on A side

## 2014-05-16 NOTE — Telephone Encounter (Signed)
Received form, given to RA for signature.

## 2014-05-19 NOTE — Telephone Encounter (Signed)
Form faxed and sent to scan.  Nothing further needed.

## 2014-05-26 ENCOUNTER — Emergency Department (HOSPITAL_COMMUNITY)
Admission: EM | Admit: 2014-05-26 | Discharge: 2014-05-27 | Disposition: A | Discharge status: Home / Self Care | Payer: 59 | Attending: Emergency Medicine | Admitting: Emergency Medicine

## 2014-05-26 ENCOUNTER — Encounter (HOSPITAL_COMMUNITY): Payer: Self-pay

## 2014-05-26 DIAGNOSIS — Z862 Personal history of diseases of the blood and blood-forming organs and certain disorders involving the immune mechanism: Secondary | ICD-10-CM | POA: Insufficient documentation

## 2014-05-26 DIAGNOSIS — J45909 Unspecified asthma, uncomplicated: Secondary | ICD-10-CM | POA: Diagnosis not present

## 2014-05-26 DIAGNOSIS — K1379 Other lesions of oral mucosa: Secondary | ICD-10-CM | POA: Insufficient documentation

## 2014-05-26 DIAGNOSIS — Z7951 Long term (current) use of inhaled steroids: Secondary | ICD-10-CM | POA: Insufficient documentation

## 2014-05-26 DIAGNOSIS — Z79899 Other long term (current) drug therapy: Secondary | ICD-10-CM | POA: Diagnosis not present

## 2014-05-26 DIAGNOSIS — R05 Cough: Secondary | ICD-10-CM

## 2014-05-26 DIAGNOSIS — Z87828 Personal history of other (healed) physical injury and trauma: Secondary | ICD-10-CM | POA: Insufficient documentation

## 2014-05-26 DIAGNOSIS — Z8639 Personal history of other endocrine, nutritional and metabolic disease: Secondary | ICD-10-CM | POA: Diagnosis not present

## 2014-05-26 DIAGNOSIS — Z8742 Personal history of other diseases of the female genital tract: Secondary | ICD-10-CM | POA: Diagnosis not present

## 2014-05-26 DIAGNOSIS — R059 Cough, unspecified: Secondary | ICD-10-CM

## 2014-05-26 MED ORDER — ALBUTEROL SULFATE (2.5 MG/3ML) 0.083% IN NEBU
5.0000 mg | INHALATION_SOLUTION | Freq: Once | RESPIRATORY_TRACT | Status: AC
  Administered 2014-05-26: 5 mg via RESPIRATORY_TRACT
  Filled 2014-05-26: qty 6

## 2014-05-26 NOTE — ED Provider Notes (Signed)
CSN: [id number]     Arrival date & time 05/26/14  2259 History  This chart was scribed for non-physician provider Sharilyn Sites, PA-C, working with Tomasita Crumble, MD by Phillis Haggis, ED Scribe. This patient was seen in room WTR8/WTR8 and patient care was started at 11:35 PM.   Chief Complaint  Patient presents with  . Cough   Patient is a 34 y.o. female presenting with cough. The history is provided by the patient. No language interpreter was used.  Cough Associated symptoms: no chest pain and no headaches     HPI Comments: Madeline Gomez is a 34 y.o. Female with a history of asthma who presents to the Emergency Department complaining of a productive cough onset earlier today. She states that the cough was dry at first, but then became productive with yellow and green sputum. She states that daughter had cough over the weekend.  She states that she used albuterol inhaler twice today, but it burned her throat so she did not use it anymore.  Notes subjective fever and chills.  She denies chest pain or SOB.  She also feels like she has cold sores that are about to "flare up".  She states hx of same.  She denies any difficulty swallowing or sore throat.  VSS.  Past Medical History  Diagnosis Date  . H/O vitamin D deficiency   . Anemia   . Asthma   . BV (bacterial vaginosis)     RECURRENT  . Broken bones     RIGHT FOOT   . Thyroid disease    Past Surgical History  Procedure Laterality Date  . Cesarean section    . Wisdom tooth extraction     Family History  Problem Relation Age of Onset  . Glaucoma Paternal Grandfather   . Glaucoma Paternal Grandmother   . Heart disease Maternal Grandmother   . Asthma Maternal Grandmother   . Anemia Maternal Grandmother   . Hyperlipidemia Father   . Cancer Mother     THYROID AND CERVICAL  . Hypertension Mother    History  Substance Use Topics  . Smoking status: Never Smoker   . Smokeless tobacco: Never Used  . Alcohol Use: No   OB History     Gravida Para Term Preterm AB TAB SAB Ectopic Multiple Living   3 3  0      3     Review of Systems  HENT: Positive for mouth sores. Negative for trouble swallowing.   Respiratory: Positive for cough.   Cardiovascular: Negative for chest pain.  Gastrointestinal: Negative for nausea, vomiting and diarrhea.  Neurological: Negative for dizziness and headaches.  All other systems reviewed and are negative.   Allergies  Other; Percocet; and Prednisone  Home Medications   Prior to Admission medications   Medication Sig Start Date End Date Taking? Authorizing Provider  albuterol (PROVENTIL HFA;VENTOLIN HFA) 108 (90 BASE) MCG/ACT inhaler Inhale 2 puffs into the lungs every 4 (four) hours as needed for wheezing. 05/10/12   Arthor Captain, PA-C  budesonide-formoterol (SYMBICORT) 160-4.5 MCG/ACT inhaler Inhale 2 puffs into the lungs 2 (two) times daily. 05/02/14   Barbaraann Share, MD  clonazePAM (KLONOPIN) 1 MG tablet Take 1 mg by mouth at bedtime as needed for anxiety.  06/02/12   Historical Provider, MD  methylPREDNISolone (MEDROL) 8 MG tablet Take  a day for 2 days, then  a day for 2 days, then 8 mg a day for 2 days, then stop 05/02/14   Mellody Dance  Harle StanfordM Clance, MD   BP 128/83 mmHg  Pulse 119  Temp(Src) 97.8 F (36.6 C) (Oral)  Resp 18  SpO2 100%   Physical Exam  Constitutional: She is oriented to person, place, and time. She appears well-developed and well-nourished. No distress.  HENT:  Head: Normocephalic and atraumatic.  Right Ear: Tympanic membrane and ear canal normal.  Left Ear: Tympanic membrane and ear canal normal.  Nose: Nose normal.  Mouth/Throat: Uvula is midline, oropharynx is clear and moist and mucous membranes are normal. No oropharyngeal exudate, posterior oropharyngeal edema, posterior oropharyngeal erythema or tonsillar abscesses.  No oral lesions noted  Eyes: Conjunctivae and EOM are normal. Pupils are equal, round, and reactive to light.  Neck: Normal range of  motion. Neck supple.  Cardiovascular: Normal rate, regular rhythm and normal heart sounds.   Pulmonary/Chest: Effort normal and breath sounds normal. No respiratory distress. She has no wheezes. She has no rales.  Musculoskeletal: Normal range of motion.  Neurological: She is alert and oriented to person, place, and time.  Skin: Skin is warm and dry. She is not diaphoretic.  Psychiatric: She has a normal mood and affect.  Nursing note and vitals reviewed.   ED Course  Procedures (including critical care time) DIAGNOSTIC STUDIES: Oxygen Saturation is 100% on room air, normal by my interpretation.    COORDINATION OF CARE: 11:42 PM-Discussed treatment plan which includes x-ray with pt at bedside and pt agreed to plan.   Labs Review Labs Reviewed - No data to display  Imaging Review Dg Chest 2 View  05/27/2014   CLINICAL DATA:  Cough  EXAM: CHEST  2 VIEW  COMPARISON:  09/19/2012, 02/29/2008  FINDINGS: The heart size and mediastinal contours are within normal limits. Both lungs are clear. The visualized skeletal structures are unremarkable.  IMPRESSION: No radiographic evidence of active cardiopulmonary disease.   Electronically Signed   By: Jearld LeschAndrew  DelGaizo M.D.   On: 05/27/2014 00:57     EKG Interpretation None      MDM   Final diagnoses:  Cough   34 y.o. F with cough, subjective fever/chills.  Patient afebrile and non-toxic.  Her lung sounds are overall clear, no distress noted.  CXR was obtained which is negative for acute findings.  There are no visible oral lesions at this time.  Will d/c home with supportive care, encouraged OTC cough meds as needed as well as abbreva for cold sores.  FU with PCP.  Discussed plan with patient, he/she acknowledged understanding and agreed with plan of care.  Return precautions given for new or worsening symptoms.  Patient with slight tachycardia on d/c-- suspect this is due to albuterol treatment that was given here in ED.  I personally  performed the services described in this documentation, which was scribed in my presence. The recorded information has been reviewed and is accurate.  Garlon HatchetLisa M Sanders, PA-C 05/27/14 0129  Garlon HatchetLisa M Sanders, PA-C 05/27/14 0130  Tomasita CrumbleAdeleke Oni, MD 05/27/14 [phone]

## 2014-05-26 NOTE — ED Notes (Signed)
Patient reports a productive cough and fever that started this morning.  Also, states she has cold sores in her mouth that are getting ready to "flare up."

## 2014-05-26 NOTE — ED Notes (Signed)
Respiratory called for breathing treatment.

## 2014-05-27 ENCOUNTER — Emergency Department (HOSPITAL_COMMUNITY): Payer: 59

## 2014-05-27 NOTE — Discharge Instructions (Signed)
May use over the counter cough syrup as need (delsym, robitussin, etc. Are available over the counter). May take tylenol or Motrin as needed for fever. Follow-up with primary care physician. Return here for new concerns.

## 2014-06-28 ENCOUNTER — Encounter: Payer: Self-pay | Admitting: Pulmonary Disease

## 2014-06-28 ENCOUNTER — Ambulatory Visit (INDEPENDENT_AMBULATORY_CARE_PROVIDER_SITE_OTHER): Payer: 59 | Admitting: Pulmonary Disease

## 2014-06-28 VITALS — BP 115/80 | HR 87 | Ht 63.75 in | Wt 178.4 lb

## 2014-06-28 DIAGNOSIS — J45909 Unspecified asthma, uncomplicated: Secondary | ICD-10-CM | POA: Diagnosis not present

## 2014-06-28 NOTE — Assessment & Plan Note (Addendum)
Take symbicort twice daily - rinse mouth after Refills on singulair & ventolin We reviewed need for maintenance medications-and however if she were more compliant with this, would reduce the need for her rescue medication. FMLA papers were filled out A letter was given to her for work today and for her gym membership

## 2014-06-28 NOTE — Progress Notes (Signed)
   Subjective:    Patient ID: Trenton Gammonheryl J Dante, female    DOB: November 05, 1980, 34 y.o.   MRN: [id number]  HPI  34 year old never smoker presents for FU of asthma.  Her symptoms first started around her first pregnancy in 2003 when she lived in [place]. She moved to [place] about 2006 and her asthma has been worse since then likely due to allergies. Her triggers include allergies and odors. She had ER visits in November 2013 in April 2014. She reports an allergy to prednisone, in the form of breaking out and insomnia, which may have been related to taking increased dosage- She admits to using albuterol nebs from her children.  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 She has 2 dogs at home and denies any environmental triggers.  She is also maintained on clonazepam for anxiety but does not use this much due to somnolence. I also note trial of BuSpar in the past   Significant tests/ events  Spirometry  11/2012 - FEv1 77%, FVC 101%, ratio 65 s/o mild obstruction   08/30/2013 Spirometry >>moderate airway obstruction with FEV1 65%, FVC 86% and ratio 65  04/2014 acute OV - poor compliance  06/28/2014  Chief Complaint  Patient presents with  . Follow-up    ASTHMA: feeling fine. Only using the Symbicort once a day instead of twice daily.  Patient needs FMLA paperwork.  Discuss options for exercise.   She uses Symbicort only once a day Continues to need rescue inhaler-she often takes this from her children's supply. No ER visits or hospitalizations She needs FMLA papers filled out-these were filled out last year She also needs a letter for her gym membership Asthma control was reviewed  CXR 05/27/14- no infiltrates or effusions I have reviewed all relevant imaging, labs & test data & reconciled meds   Past Medical History  Diagnosis Date  . H/O vitamin D deficiency   . Anemia   . Asthma   . BV (bacterial vaginosis)      RECURRENT  . Broken bones     RIGHT FOOT   . Thyroid disease      Review of Systems neg for any significant sore throat, dysphagia, itching, sneezing, nasal congestion or excess/ purulent secretions, fever, chills, sweats, unintended wt loss, pleuritic or exertional cp, hempoptysis, orthopnea pnd or change in chronic leg swelling. Also denies presyncope, palpitations, heartburn, abdominal pain, nausea, vomiting, diarrhea or change in bowel or urinary habits, dysuria,hematuria, rash, arthralgias, visual complaints, headache, numbness weakness or ataxia.     Objective:   Physical Exam   Gen. Pleasant, obese, in no distress ENT - no lesions, no post nasal drip Neck: No JVD, no thyromegaly, no carotid bruits Lungs: no use of accessory muscles, no dullness to percussion, decreased without rales or rhonchi  Cardiovascular: Rhythm regular, heart sounds  normal, no murmurs or gallops, no peripheral edema Musculoskeletal: No deformities, no cyanosis or clubbing , no tremors      Assessment & Plan:

## 2014-06-28 NOTE — Patient Instructions (Signed)
Take symbicort twice daily - rinse mouth after Refills on singulair & ventolin FMLA papers will be filled out

## 2014-10-27 ENCOUNTER — Other Ambulatory Visit: Payer: Self-pay | Admitting: Obstetrics and Gynecology

## 2014-10-27 DIAGNOSIS — N6310 Unspecified lump in the right breast, unspecified quadrant: Secondary | ICD-10-CM

## 2014-11-02 ENCOUNTER — Ambulatory Visit
Admission: RE | Admit: 2014-11-02 | Discharge: 2014-11-02 | Disposition: A | Discharge status: Home / Self Care | Payer: 59 | Source: Ambulatory Visit | Attending: Obstetrics and Gynecology | Admitting: Obstetrics and Gynecology

## 2014-11-02 ENCOUNTER — Other Ambulatory Visit: Payer: Self-pay | Admitting: Obstetrics and Gynecology

## 2014-11-02 DIAGNOSIS — N6310 Unspecified lump in the right breast, unspecified quadrant: Secondary | ICD-10-CM

## 2015-01-02 ENCOUNTER — Ambulatory Visit (INDEPENDENT_AMBULATORY_CARE_PROVIDER_SITE_OTHER): Payer: 59 | Admitting: Adult Health

## 2015-01-02 ENCOUNTER — Encounter: Payer: Self-pay | Admitting: Adult Health

## 2015-01-02 ENCOUNTER — Telehealth: Payer: Self-pay | Admitting: Pulmonary Disease

## 2015-01-02 VITALS — BP 120/82 | HR 79 | Temp 98.5°F | Ht 64.0 in | Wt 199.0 lb

## 2015-01-02 DIAGNOSIS — J309 Allergic rhinitis, unspecified: Secondary | ICD-10-CM | POA: Diagnosis not present

## 2015-01-02 DIAGNOSIS — J4531 Mild persistent asthma with (acute) exacerbation: Secondary | ICD-10-CM

## 2015-01-02 MED ORDER — BUDESONIDE-FORMOTEROL FUMARATE 160-4.5 MCG/ACT IN AERO: 2.0000 | INHALATION_SPRAY | Freq: Two times a day (BID) | RESPIRATORY_TRACT | Status: DC

## 2015-01-02 NOTE — Patient Instructions (Signed)
Restart Symbicort 2 puffs twice daily, rinse after inhaler use. Use ProAire where 2 puffs every 4 hours as needed only. This is your rescue inhaler. Restart Singulair daily May use Zyrtec 10 mg at bedtime as needed for drainage. Follow-up with Dr. Vassie LollAlva in 2-3 months and as needed Please contact office for sooner follow up if symptoms do not improve or worsen or seek emergency care  ]

## 2015-01-02 NOTE — Progress Notes (Signed)
Subjective:    Patient ID: Madeline Gomez, female    DOB: 04/04/1980, 34 y.o.   MRN: [id number]  HPI 34 yo female with Asthma .   Her symptoms first started around her first pregnancy in 2003 when she lived in [place]. She moved to [place] about 2006 and her asthma has been worse since then likely due to allergies. Her triggers include allergies and odors. 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 She has 2 dogs at home and denies any environmental triggers.  She is also maintained on clonazepam for anxiety but does not use this much due to somnolence. I also note trial of BuSpar in the past   Significant tests/ events  Spirometry 11/2012 - FEv1 77%, FVC 101%, ratio 65 s/o mild obstruction   08/30/2013 Spirometry >>moderate airway obstruction with FEV1 65%, FVC 86% and ratio 65  04/2014 acute OV - poor compliance  05/2014 CXR w/ nad    01/02/2015 Follow up : Asthma  Patient returns for a six-month follow-up. Says that her asthma has not been doing as well. Over last few months. Uses pro air most days, 2-3 times. Admits not taking Symbicort , has not had it for 2-3 months.. We discussed asthma control . Reviewed controller inhalers versus rescue inhaler. She says she has intermittent wheezing. Also has not been taking any Singulair. She denies any chest pain, hemoptysis, fever, orthopnea, PND or leg swelling.  Past Medical History  Diagnosis Date  . H/O vitamin D deficiency   . Anemia   . Asthma   . BV (bacterial vaginosis)     RECURRENT  . Broken bones     RIGHT FOOT   . Thyroid disease    Current Outpatient Prescriptions on File Prior to Visit  Medication Sig Dispense Refill  . albuterol (PROVENTIL HFA;VENTOLIN HFA) 108 (90 BASE) MCG/ACT inhaler Inhale 2 puffs into the lungs every 4 (four) hours as needed for wheezing. 1 Inhaler 3  . budesonide-formoterol (SYMBICORT) 160-4.5 MCG/ACT inhaler Inhale  2 puffs into the lungs 2 (two) times daily. 2 Inhaler 0  . montelukast (SINGULAIR) 10 MG tablet Take 10 mg by mouth at bedtime.    . clonazePAM (KLONOPIN) 1 MG tablet Take 1 mg by mouth at bedtime as needed for anxiety.      No current facility-administered medications on file prior to visit.         Review of Systems Constitutional:   No  weight loss, night sweats,  Fevers, chills, fatigue, or  lassitude.  HEENT:   No headaches,  Difficulty swallowing,  Tooth/dental problems, or  Sore throat,                No sneezing, itching, ear ache,  +nasal congestion, post nasal drip,   CV:  No chest pain,  Orthopnea, PND, swelling in lower extremities, anasarca, dizziness, palpitations, syncope.   GI  No heartburn, indigestion, abdominal pain, nausea, vomiting, diarrhea, change in bowel habits, loss of appetite, bloody stools.   Resp:   No chest wall deformity  Skin: no rash or lesions.  GU: no dysuria, change in color of urine, no urgency or frequency.  No flank pain, no hematuria   MS:  No joint pain or swelling.  No decreased range of motion.  No back pain.  Psych:  No change in mood or affect. No depression or anxiety.  No memory loss.  Objective:   Physical Exam GEN: A/Ox3; pleasant , NAD, well nourished   HEENT:  [place]/AT,  EACs-clear, TMs-wnl, NOSE-clear, THROAT-clear, no lesions, no postnasal drip or exudate noted.   NECK:  Supple w/ fair ROM; no JVD; normal carotid impulses w/o bruits; no thyromegaly or nodules palpated; no lymphadenopathy.  RESP  Clear  P & A; w/o, wheezes/ rales/ or rhonchi.no accessory muscle use, no dullness to percussion  CARD:  RRR, no m/r/g  , no peripheral edema, pulses intact, no cyanosis or clubbing.  GI:   Soft & nt; nml bowel sounds; no organomegaly or masses detected.  Musco: Warm bil, no deformities or joint swelling noted.   Neuro: alert, no focal deficits noted.    Skin: Warm, no lesions or rashes         Assessment &  Plan:

## 2015-01-02 NOTE — Assessment & Plan Note (Signed)
Restart Singulair  Add zyrtec At bedtime  As needed

## 2015-01-02 NOTE — Telephone Encounter (Signed)
Symbicort RX has been refilled. Nothing further needed

## 2015-01-02 NOTE — Assessment & Plan Note (Signed)
Asthma, not optimally controlled due to medication  Noncompliance Symbicort co-pay card was given to help with cost issues.  Control for triggers such as postnasal drip.with  Zyrtec  Plan  Restart Symbicort 2 puffs twice daily, rinse after inhaler use. Use ProAire where 2 puffs every 4 hours as needed only. This is your rescue inhaler. Restart Singulair daily May use Zyrtec 10 mg at bedtime as needed for drainage. Follow-up with Dr. Vassie LollAlva in 2-3 months and as needed Please contact office for sooner follow up if symptoms do not improve or worsen or seek emergency care  ]

## 2015-01-03 NOTE — Progress Notes (Signed)
Reviewed & agree with plan  

## 2015-01-09 ENCOUNTER — Telehealth: Payer: Self-pay | Admitting: Pulmonary Disease

## 2015-01-09 MED ORDER — MONTELUKAST SODIUM 10 MG PO TABS: 10.0000 mg | ORAL_TABLET | Freq: Every day | ORAL | Status: DC

## 2015-01-09 NOTE — Telephone Encounter (Signed)
Patient Returned call [phone]

## 2015-01-09 NOTE — Telephone Encounter (Signed)
Called and spoke with pt Pt stated needing a refill on her Singulair sent to [facility] [address]  Informed pt that refill would be sent  Nothing further is needed at this time.

## 2015-01-09 NOTE — Telephone Encounter (Signed)
LMTCB x 1 

## 2015-05-01 ENCOUNTER — Encounter: Payer: Self-pay | Admitting: Pulmonary Disease

## 2015-05-01 ENCOUNTER — Ambulatory Visit (INDEPENDENT_AMBULATORY_CARE_PROVIDER_SITE_OTHER): Payer: 59 | Admitting: Pulmonary Disease

## 2015-05-01 VITALS — BP 124/82 | HR 88 | Ht 63.75 in | Wt 205.6 lb

## 2015-05-01 DIAGNOSIS — J301 Allergic rhinitis due to pollen: Secondary | ICD-10-CM

## 2015-05-01 DIAGNOSIS — J4530 Mild persistent asthma, uncomplicated: Secondary | ICD-10-CM

## 2015-05-01 NOTE — Assessment & Plan Note (Signed)
FMLA papers  filled out Symbicort as controller med -take this twice daily during pollen season Stay on singulair at night

## 2015-05-01 NOTE — Assessment & Plan Note (Signed)
Ct zyrtec 

## 2015-05-01 NOTE — Progress Notes (Signed)
   Subjective:    Patient ID: Madeline Gomez, female    DOB: 01/21/1981, 35 y.o.   MRN: [id number]  HPI 35 yo female with Asthma .   Her symptoms first started around her first pregnancy in 2003 when she lived in [place]. [place] to [place] about 2006 and her asthma has been worse since then likely due to allergies. Her triggers include allergies and odors. 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 She has 2 dogs at home and denies any environmental triggers.  She is also maintained on clonazepam for anxiety but does not use this much due to somnolence. I also note trial of BuSpar in the past    05/01/2015  Chief Complaint  Patient presents with  . Follow-up    Asthma doing okay, flared up with the pollen build up.  needs FMLA paperwork filled out.     7463m FU Poor compliance noted last OV - high copay FMLA papers fille dout- she works frm home but this time of year, needs longer breaks to do her breathing Rx No nocturnal symptoms   Significant tests/ events  Spirometry 11/2012 - FEv1 77%, FVC 101%, ratio 65 s/o mild obstruction   08/30/2013 Spirometry >>moderate airway obstruction with FEV1 65%, FVC 86% and ratio 65  04/2014 acute OV - poor compliance       Review of Systems Patient denies significant dyspnea,cough, hemoptysis,  chest pain, palpitations, pedal edema, orthopnea, paroxysmal nocturnal dyspnea, lightheadedness, nausea, vomiting, abdominal or  leg pains      Objective:   Physical Exam  Gen. Pleasant, obese, in no distress ENT - no lesions, no post nasal drip Neck: No JVD, no thyromegaly, no carotid bruits Lungs: no use of accessory muscles, no dullness to percussion, decreased without rales or rhonchi  Cardiovascular: Rhythm regular, heart sounds  normal, no murmurs or gallops, no peripheral edema Musculoskeletal: No deformities, no cyanosis or clubbing , no tremors         Assessment & Plan:

## 2015-05-01 NOTE — Patient Instructions (Signed)
FMLA paper will be filled out Symbicort as controller med -take this twice daily during pollen season Stay on singulair at night

## 2015-05-30 ENCOUNTER — Telehealth: Payer: Self-pay | Admitting: Pulmonary Disease

## 2015-05-30 NOTE — Telephone Encounter (Signed)
lmtcb X1 for pt  

## 2015-05-31 NOTE — Telephone Encounter (Signed)
Called and spoke with patient, advised her that I would contact [facility] in the AM and call her back in the morning. Keep in my box until I sign off.

## 2015-05-31 NOTE — Telephone Encounter (Signed)
Pt returning call. pls call back at [phone], can call until 10am.

## 2015-05-31 NOTE — Telephone Encounter (Signed)
Last ov with RA 05/01/15  Patient Instructions       FMLA paper will be filled out Symbicort as controller med -take this twice daily during pollen season Stay on singulair at night      Called spoke with pt. She states that at her last ov with RA she brought FMLA papers for him to fill out. She states that before he left the room she handed them to him to update. She states she is calling because she called medical records and they have no received any forms. I spoke with Marcelino DusterMichelle and looked in RA's to do folder and did not see the forms. I explained to her that I would send a message to RA. She voiced understanding and had no further questions.   RA please advise

## 2015-06-01 NOTE — Telephone Encounter (Signed)
Called [facility] at [facility], she has not received paperwork that was sent to her on 05/01/15.   Madeline Gomez stated that she would pull up patient's last forms that are in Epic and update the forms and have Dr. Vassie LollAlva sign them. Advised patient that Madeline Gomez is working on the forms and we will contact her when they have been completed. Will hold in my box until done.

## 2015-06-07 NOTE — Telephone Encounter (Signed)
Called and left message for Joni Reiningicole to call back to give update on FMLA paperwork.  Have not seen any papers for Dr. Vassie LollAlva to sign. Awaiting call back from Cane SavannahNicole.

## 2015-06-08 NOTE — Telephone Encounter (Signed)
Called spoke with [place]. She states that she has no received anything on pt. I explained to her that I would send a message to [facility] for follow [facility]. She voiced understanding and had no further questions.   Marcelino DusterMichelle please advise

## 2015-06-08 NOTE — Telephone Encounter (Signed)
Called patient and advised her that we need [facility].  She said she would contact her employer and have them fax us another copy of the FMLA form.   Awaiting fax.

## 2015-06-08 NOTE — Telephone Encounter (Signed)
LVM for Nicole to return call

## 2015-06-12 NOTE — Telephone Encounter (Signed)
Called and asked patient if she asked her employer to re-fax forms to us.  She said that she left a message on her caseworkers voicemail, but that she would try to call again today. Awaiting fax.

## 2015-06-13 NOTE — Telephone Encounter (Signed)
We do not have this fax. Will await fax.

## 2015-06-15 NOTE — Telephone Encounter (Signed)
Called and spoke with patient, advised her to have her employer send us the forms if she still needs them completed. She said that she would do so.   Closing encounter at this time.

## 2015-06-15 NOTE — Telephone Encounter (Signed)
Marcelino DusterMichelle, has this fax been received yet?

## 2015-06-16 ENCOUNTER — Telehealth: Payer: Self-pay | Admitting: Pulmonary Disease

## 2015-06-16 NOTE — Telephone Encounter (Signed)
LVM for pt to return call

## 2015-06-20 NOTE — Telephone Encounter (Signed)
lmtcb X2 for pt.  

## 2015-06-21 NOTE — Telephone Encounter (Signed)
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  CB [phone].

## 2015-06-21 NOTE — Telephone Encounter (Signed)
lmtcb X3 for pt. Letter sent Will close per triage protocol.

## 2015-06-22 NOTE — Telephone Encounter (Signed)
Will hold in triage to call between 2p-5pm today

## 2015-06-22 NOTE — Telephone Encounter (Signed)
Spoke with pt and she states that after she talked with Elon JesterMichele on 06/15/15 she had her employer refax her FMLA forms to [employer] [place] [employer].  Elon JesterMichele , did you receive the forms?

## 2015-06-22 NOTE — Telephone Encounter (Signed)
Called spoke with pt. Informed her that the FMLA forms have not been received by Marcelino DusterMichelle and they are not in RA's look at. She states she will contact her office again and have them fax the forms to [phone]. She voiced understanding and had no further questions. Nothing further needed at this time.

## 2015-06-22 NOTE — Telephone Encounter (Signed)
[facility], will check my box tomorrow to see if forms are in my box.  As of today, I have not received them.

## 2015-07-26 ENCOUNTER — Other Ambulatory Visit: Payer: Self-pay | Admitting: Obstetrics and Gynecology

## 2016-08-30 ENCOUNTER — Encounter (HOSPITAL_COMMUNITY): Payer: Self-pay | Admitting: *Deleted

## 2016-08-30 ENCOUNTER — Ambulatory Visit (HOSPITAL_COMMUNITY)
Admission: EM | Admit: 2016-08-30 | Discharge: 2016-08-30 | Disposition: A | Discharge status: Home / Self Care | Payer: 59 | Attending: Family Medicine | Admitting: Family Medicine

## 2016-08-30 DIAGNOSIS — T783XXA Angioneurotic edema, initial encounter: Secondary | ICD-10-CM

## 2016-08-30 MED ORDER — HYDROXYZINE HCL 25 MG PO TABS: 25.0000 mg | ORAL_TABLET | Freq: Four times a day (QID) | ORAL | 0 refills | Status: DC

## 2016-08-30 MED ORDER — METHYLPREDNISOLONE 4 MG PO TBPK: ORAL_TABLET | ORAL | 0 refills | Status: DC

## 2016-08-30 NOTE — ED Triage Notes (Signed)
Developed  Facial   Swelling      And itching      After  Eating   At  [employer]  Pt  Has  Food  Allergies    Symptoms   Started  Last  Pm        Pt  Speaking        In  Complete   sentances       Has   Itching  And  Tingling  In face   And  Cheeks

## 2016-08-30 NOTE — ED Provider Notes (Signed)
CSN: [id number]     Arrival date & time 08/30/16  1235 History   None    Chief Complaint  Patient presents with  . Allergic Reaction   (Consider location/radiation/quality/duration/timing/severity/associated sxs/prior Treatment) [facility] and developed some lip swelling and itching this am.   The history is provided by the patient.  Allergic Reaction  Presenting symptoms: itching, rash and swelling   Severity:  Moderate Duration:  2 hours Context: food   Relieved by:  Nothing Worsened by:  Nothing   Past Medical History:  Diagnosis Date  . Anemia   . Asthma   . Broken bones    RIGHT FOOT   . BV (bacterial vaginosis)    RECURRENT  . H/O vitamin D deficiency   . Thyroid disease    Past Surgical History:  Procedure Laterality Date  . CESAREAN SECTION    . WISDOM TOOTH EXTRACTION     Family History  Problem Relation Age of Onset  . Glaucoma Paternal Grandfather   . Glaucoma Paternal Grandmother   . Heart disease Maternal Grandmother   . Asthma Maternal Grandmother   . Anemia Maternal Grandmother   . Cancer Mother        THYROID AND CERVICAL  . Hypertension Mother   . Hyperlipidemia Father    Social History  Substance Use Topics  . Smoking status: Never Smoker  . Smokeless tobacco: Never Used  . Alcohol use No   OB History    Gravida Para Term Preterm AB Living   3 3   0   3   SAB TAB Ectopic Multiple Live Births                 Review of Systems  Constitutional: Negative.   HENT: Negative.   Eyes: Negative.   Respiratory: Negative.   Cardiovascular: Negative.   Gastrointestinal: Negative.   Endocrine: Negative.   Genitourinary: Negative.   Musculoskeletal: Negative.   Skin: Positive for itching and rash.  Allergic/Immunologic: Negative.   Neurological: Negative.     Allergies  Other; Percocet [oxycodone-acetaminophen]; and Prednisone  Home Medications   Prior to Admission medications   Medication Sig Start Date End  Date Taking? Authorizing Provider  albuterol (ACCUNEB) 0.63 MG/3ML nebulizer solution Take 1 ampule by nebulization every 6 (six) hours as needed for wheezing.    [provider]  albuterol (PROVENTIL HFA;VENTOLIN HFA) 108 (90 BASE) MCG/ACT inhaler Inhale 2 puffs into the lungs every 4 (four) hours as needed for wheezing. 05/10/12   Arthor CaptainHarris, Abigail, PA-C  budesonide-formoterol (SYMBICORT) 160-4.5 MCG/ACT inhaler Inhale 2 puffs into the lungs 2 (two) times daily. 01/02/15   Parrett, Virgel Bouquetammy S, NP  cetirizine (ZYRTEC) 10 MG tablet Take 10 mg by mouth daily. Reported on 05/01/2015    [provider]  hydrOXYzine (ATARAX/VISTARIL) 25 MG tablet Take 1 tablet (25 mg total) by mouth every 6 (six) hours. 08/30/16   Deatra Canterxford, Rey Dansby J, FNP  methylPREDNISolone (MEDROL DOSEPAK) 4 MG TBPK tablet Take 6-5-4-3-2-1 po qd 08/30/16   Deatra Canterxford, Greyden Besecker J, FNP  montelukast (SINGULAIR) 10 MG tablet Take 1 tablet (10 mg total) by mouth at bedtime. 01/09/15   Oretha MilchAlva, Rakesh V, MD   Meds Ordered and Administered this Visit  Medications - No data to display  BP 118/72 (BP Location: Right Arm)   Pulse 82   Temp 98.6 F (37 C) (Oral)   Resp 18   SpO2 100%  No data found.   Physical Exam  Constitutional:  She is oriented to person, place, and time. She appears well-developed and well-nourished.  HENT:  Head: Normocephalic and atraumatic.  Right Ear: External ear normal.  Left Ear: External ear normal.  Mouth/Throat: Oropharynx is clear and moist.  Lips with swelling  Eyes: Pupils are equal, round, and reactive to light. Conjunctivae and EOM are normal.  Neck: Normal range of motion. Neck supple.  Cardiovascular: Normal rate, regular rhythm and normal heart sounds.   Pulmonary/Chest: Effort normal and breath sounds normal.  Neurological: She is alert and oriented to person, place, and time.  Nursing note and vitals reviewed.   Urgent Care Course     Procedures (including critical care time)  Labs  Review Labs Reviewed - No data to display  Imaging Review No results found.   Visual Acuity Review  Right Eye Distance:   Left Eye Distance:   Bilateral Distance:    Right Eye Near:   Left Eye Near:    Bilateral Near:         MDM   1. Angioedema, initial encounter    Medrol dose pack as directed  Hydroxyzine 25mg  one po q 6 hours prn #12      Deatra CanterOxford, Regene Mccarthy J, FNP 08/30/16 1458

## 2017-02-25 ENCOUNTER — Ambulatory Visit: Payer: 59 | Admitting: Emergency Medicine

## 2017-02-25 ENCOUNTER — Encounter: Payer: Self-pay | Admitting: Emergency Medicine

## 2017-02-25 DIAGNOSIS — J4531 Mild persistent asthma with (acute) exacerbation: Secondary | ICD-10-CM

## 2017-02-25 MED ORDER — PREDNISONE 20 MG PO TABS: 40.0000 mg | ORAL_TABLET | Freq: Every day | ORAL | 0 refills | Status: DC

## 2017-02-25 NOTE — Addendum Note (Signed)
Addended by: Jaynee EaglesLEMONS, LINDSAY C on: 02/25/2017 11:35 AM   Modules accepted: Orders

## 2017-02-25 NOTE — Progress Notes (Signed)
Subjective:    Patient ID: Madeline Gomez, female    DOB: 1980/05/04, 37 y.o.   MRN: [id number]  HPI 37 year old woman who has been followed by Dr. Vassie LollAlva in our office for mild persistent asthma.  Also has environmental allergies. She was formerly on Symbicort, singulair, has been off for months.   She presents today reporting that she has been experiencing increased dyspnea, wheeze since coming home from out of town recently. She is having increased chest tightness, increased albuterol neb use, approximately 3x a day. She reports a lot of throat clearing.   They have recently found water damage in her home in the last few months. She is talking to her landlord about repairs.    Review of Systems  Past Medical History:  Diagnosis Date  . Anemia   . Asthma   . Broken bones    RIGHT FOOT   . BV (bacterial vaginosis)    RECURRENT  . H/O vitamin D deficiency   . Thyroid disease      Family History  Problem Relation Age of Onset  . Glaucoma Paternal Grandfather   . Glaucoma Paternal Grandmother   . Heart disease Maternal Grandmother   . Asthma Maternal Grandmother   . Anemia Maternal Grandmother   . Cancer Mother        THYROID AND CERVICAL  . Hypertension Mother   . Hyperlipidemia Father      Social History   Socioeconomic History  . Marital status: Single    Spouse name: Not on file  . Number of children: Not on file  . Years of education: Not on file  . Highest education level: Not on file  Social Needs  . Financial resource strain: Not on file  . Food insecurity - worry: Not on file  . Food insecurity - inability: Not on file  . Transportation needs - medical: Not on file  . Transportation needs - non-medical: Not on file  Occupational History  . Not on file  Tobacco Use  . Smoking status: Never Smoker  . Smokeless tobacco: Never Used  Substance and Sexual Activity  . Alcohol use: No  . Drug use: No  . Sexual activity: Not Currently    Birth  control/protection: IUD    Comment: MIRENA  Other Topics Concern  . Not on file  Social History Narrative  . Not on file     Allergies  Allergen Reactions  . Other     PT IS ALLERGIC TO GRASS, CATS, TOMATOES, PINEAPPLES, POLLEN  . Percocet [Oxycodone-Acetaminophen] Itching  . Prednisone Itching    Rapid heart rate, made me feel bad     Outpatient Medications Prior to Visit  Medication Sig Dispense Refill  . albuterol (ACCUNEB) 0.63 MG/3ML nebulizer solution Take 1 ampule by nebulization every 6 (six) hours as needed for wheezing.    . methylPREDNISolone (MEDROL DOSEPAK) 4 MG TBPK tablet Take 6-5-4-3-2-1 po qd 21 tablet 0  . albuterol (PROVENTIL HFA;VENTOLIN HFA) 108 (90 BASE) MCG/ACT inhaler Inhale 2 puffs into the lungs every 4 (four) hours as needed for wheezing. (Patient not taking: Reported on 02/25/2017) 1 Inhaler 3  . budesonide-formoterol (SYMBICORT) 160-4.5 MCG/ACT inhaler Inhale 2 puffs into the lungs 2 (two) times daily. (Patient not taking: Reported on 02/25/2017) 1 Inhaler 3  . cetirizine (ZYRTEC) 10 MG tablet Take 10 mg by mouth daily. Reported on 05/01/2015    . hydrOXYzine (ATARAX/VISTARIL) 25 MG tablet Take 1 tablet (25 mg total) by mouth every  6 (six) hours. 12 tablet 0  . montelukast (SINGULAIR) 10 MG tablet Take 1 tablet (10 mg total) by mouth at bedtime. 30 tablet 3   No facility-administered medications prior to visit.         Objective:   Physical Exam  Vitals:   02/25/17 1108  BP: 122/76  Pulse: 94  SpO2: 98%  Weight: 193 lb (87.5 kg)   Gen: Pleasant, well-nourished, in no distress,  normal affect  ENT: No lesions,  mouth clear,  oropharynx clear, no postnasal drip  Neck: No JVD, no stridor  Lungs: No use of accessory muscles, clear without rales or rhonchi, no wheezing  Cardiovascular: RRR, heart sounds normal, no murmur or gallops, no peripheral edema  Musculoskeletal: No deformities, no cyanosis or clubbing  Neuro: alert, non focal  Skin:  Warm, no lesions or rashes     Assessment & Plan:  Asthma with acute exacerbation She relates her waxing and waning symptoms to exposures within the home.  In particular she has water damage, possible mold exposure.  This is being addressed by her landlord.  Believe we should treat her for an acute exacerbation now with a burst of prednisone.  Continue albuterol as needed.  She may need to go back on Symbicort if addressing the home exposures is not adequate to improve her symptoms.  She denies significant rhinitis but note that she is needed to be treated for this before in the past as well.  Levy Pupa, MD, PhD 02/25/2017, 11:33 AM [facility] [phone] or if no answer [phone]

## 2017-02-25 NOTE — Assessment & Plan Note (Signed)
She relates her waxing and waning symptoms to exposures within the home.  In particular she has water damage, possible mold exposure.  This is being addressed by her landlord.  Believe we should treat her for an acute exacerbation now with a burst of prednisone.  Continue albuterol as needed.  She may need to go back on Symbicort if addressing the home exposures is not adequate to improve her symptoms.  She denies significant rhinitis but note that she is needed to be treated for this before in the past as well.

## 2017-02-25 NOTE — Patient Instructions (Signed)
Please take prednisone 40mg  daily for the next 5 days.  Keep albuterol available to use up to every 4 hours if needed for chest tightness, wheezing, shortness of breath Follow-up with Dr. Vassie LollAlva next available opening.  You will need to discuss with him possibly restarting Symbicort.

## 2017-02-28 ENCOUNTER — Telehealth: Payer: Self-pay | Admitting: Emergency Medicine

## 2017-03-03 NOTE — Telephone Encounter (Signed)
Forms were given to RB on 02/28/17. Will update chart once these are returned to me.

## 2017-03-05 NOTE — Telephone Encounter (Signed)
Forms were completed and returned to me. These have been given to [facility].

## 2017-03-06 NOTE — Telephone Encounter (Signed)
Rec'd completed paperwork - Fwd to [employer] via interoffice mail -pr  °

## 2017-04-04 ENCOUNTER — Ambulatory Visit: Payer: 59 | Admitting: Pulmonary Disease

## 2017-04-04 ENCOUNTER — Encounter: Payer: Self-pay | Admitting: Pulmonary Disease

## 2017-04-04 VITALS — BP 118/84 | HR 86 | Ht 60.0 in | Wt 196.8 lb

## 2017-04-04 DIAGNOSIS — J301 Allergic rhinitis due to pollen: Secondary | ICD-10-CM | POA: Diagnosis not present

## 2017-04-04 DIAGNOSIS — J45909 Unspecified asthma, uncomplicated: Secondary | ICD-10-CM

## 2017-04-04 MED ORDER — MOMETASONE FUROATE 110 MCG/INH IN AEPB: INHALATION_SPRAY | RESPIRATORY_TRACT | 3 refills | Status: DC

## 2017-04-04 MED ORDER — ALBUTEROL SULFATE HFA 108 (90 BASE) MCG/ACT IN AERS: 2.0000 | INHALATION_SPRAY | RESPIRATORY_TRACT | 3 refills | Status: DC | PRN

## 2017-04-04 MED ORDER — MOMETASONE FUROATE 110 MCG/INH IN AEPB: 2.0000 | INHALATION_SPRAY | Freq: Every day | RESPIRATORY_TRACT | 0 refills | Status: DC

## 2017-04-04 NOTE — Progress Notes (Signed)
   Subjective:    Patient ID: Madeline Gomez, female    DOB: 12/18/1980, 37 y.o.   MRN: [id number]  HPI 37 yo woman for FU of  Asthma .   Onset around her first pregnancy in 2003 when she lived in [place].  [place] and her asthma has been worse since then likely due to allergies. Her triggers include allergies and odors. 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 She has 2 dogs at home and denies any environmental triggers.   She had a flareup in January and required a prednisone taper and is now back to baseline.  She has stopped using her maintenance medications including Symbicort and Singulair.  In fact she does not even have an albuterol inhaler.  She does have albuterol nebs and she has used a twice in the last week. She attributes the flare to water in the crawl space of her house due to a burst pipe and feels that there may have been water exposure in that area for the last 5 years.  This has been drained now but she feels that there is still increase humidity in her home, no mold has been noted to be growing in this area  Spirometry showed mild airway obstruction with ratio 71, FEV1 of 80% and FVC of 94%     Significant tests/ events  Spirometry 11/2012 - FEv1 77%, FVC 101%, ratio 65 s/o mild obstruction   08/30/2013 Spirometry >>moderate airway obstruction with FEV1 65%, FVC 86% and ratio 65       Review of Systems Patient denies significant dyspnea,cough, hemoptysis,  chest pain, palpitations, pedal edema, orthopnea, paroxysmal nocturnal dyspnea, lightheadedness, nausea, vomiting, abdominal or  leg pains      Objective:   Physical Exam   Gen. Pleasant, well-nourished, in no distress ENT - no thrush, no post nasal drip Neck: No JVD, no thyromegaly, no carotid bruits Lungs: no use of accessory muscles, no dullness to percussion, clear without rales or rhonchi    Cardiovascular: Rhythm regular, heart sounds  normal, no murmurs or gallops, no peripheral edema Musculoskeletal: No deformities, no cyanosis or clubbing         Assessment & Plan:

## 2017-04-04 NOTE — Patient Instructions (Addendum)
Prescription for albuterol MDI 2 puffs every 6 hours as needed Trial of Asmanex - 2 puffs once daily-rinse mouth after use -we will consider stepping down in 3 months if you do well

## 2017-04-04 NOTE — Assessment & Plan Note (Signed)
Unclear trigger hence we will step up therapy with inhaled steroid  Prescription for albuterol MDI 2 puffs every 6 hours as needed Trial of Asmanex - 2 puffs once daily-rinse mouth after use -we will consider stepping down in 3 months if you do well

## 2017-04-04 NOTE — Assessment & Plan Note (Signed)
Over-the-counter antihistaminic during spring and fall

## 2017-04-09 ENCOUNTER — Telehealth: Payer: Self-pay | Admitting: Emergency Medicine

## 2017-04-10 NOTE — Telephone Encounter (Signed)
Forms were completed by RB and returned to me. These have been given back to [place].

## 2017-04-10 NOTE — Telephone Encounter (Signed)
Rec'd completed forms - fwd to [employer] via interoffice mail -pr  °

## 2017-05-28 ENCOUNTER — Encounter: Payer: Self-pay | Admitting: Adult Health

## 2017-05-28 ENCOUNTER — Other Ambulatory Visit: Payer: Self-pay | Admitting: Adult Health

## 2017-05-28 ENCOUNTER — Telehealth: Payer: Self-pay | Admitting: Pulmonary Disease

## 2017-05-28 ENCOUNTER — Ambulatory Visit: Payer: 59 | Admitting: Adult Health

## 2017-05-28 DIAGNOSIS — J4531 Mild persistent asthma with (acute) exacerbation: Secondary | ICD-10-CM

## 2017-05-28 DIAGNOSIS — J301 Allergic rhinitis due to pollen: Secondary | ICD-10-CM

## 2017-05-28 MED ORDER — FLUTICASONE FUROATE-VILANTEROL 100-25 MCG/INH IN AEPB: 1.0000 | INHALATION_SPRAY | Freq: Every day | RESPIRATORY_TRACT | 0 refills | Status: DC

## 2017-05-28 MED ORDER — FLUTICASONE FUROATE-VILANTEROL 100-25 MCG/INH IN AEPB: 1.0000 | INHALATION_SPRAY | Freq: Every day | RESPIRATORY_TRACT | 5 refills | Status: DC

## 2017-05-28 MED ORDER — PREDNISONE 10 MG PO TABS: ORAL_TABLET | ORAL | 0 refills | Status: DC

## 2017-05-28 NOTE — Progress Notes (Signed)
Reviewed & agree with plan  

## 2017-05-28 NOTE — Telephone Encounter (Signed)
[facility] sent refill request via fax for albuterol inhaler. This medication was filled in 04/04/17 with 3 refills it is to soon to refill at this time.

## 2017-05-28 NOTE — Progress Notes (Signed)
 @Patient  ID: Madeline Gomez, female    DOB: [birth year], 37 y.o.   MRN: [id number]  Chief Complaint  Patient presents with  . Acute Visit    Asthma     Referring provider: Dorothyann PengSanders, Robyn, MD  HPI: [id number] year old female never smoker followed for chronic asthma  Significant tests/ events  Spirometry 11/2012 - FEv1 77%, FVC 101%, ratio 65 s/o mild obstruction   08/30/2013 Spirometry >>moderate airway obstruction with FEV1 65%, FVC 86% and ratio 65   .05/28/2017 Acute OV : Asthma  Patient presents for an acute office visit.  Patient complains of 2 weeks of increased dry cough , wheezing , drainage , increased albuterol use. Increased nighttime symptoms . More tightness .  Patient was started on Asmanex last visit however has stopped her maintenance inhaler. Says she was good until last 2 week, feels the pollen has really made her worse.  We discussed Asthma dx with pt education and asthma action plan  Says she took prednisone taper 4 months ago with asthma flare and got so much better.      Allergies  Allergen Reactions  . Other     PT IS ALLERGIC TO GRASS, CATS, TOMATOES, PINEAPPLES, POLLEN  . Percocet [Oxycodone-Acetaminophen] Itching  . Prednisone Itching    Rapid heart rate, made me feel bad    There is no immunization history for the selected administration types on file for this patient.  Past Medical History:  Diagnosis Date  . Anemia   . Asthma   . Broken bones    RIGHT FOOT   . BV (bacterial vaginosis)    RECURRENT  . H/O vitamin D deficiency   . Thyroid disease     Tobacco History: Social History   Tobacco Use  Smoking Status Never Smoker  Smokeless Tobacco Never Used   Counseling given: Not Answered   Outpatient Encounter Medications as of 05/28/2017  Medication Sig  . albuterol (ACCUNEB) 0.63 MG/3ML nebulizer solution Take 1 ampule by nebulization every 6 (six) hours as needed for wheezing.  Marland Kitchen. albuterol (PROVENTIL HFA;VENTOLIN HFA) 108 (90  Base) MCG/ACT inhaler Inhale 2 puffs into the lungs every 4 (four) hours as needed for wheezing. (Patient not taking: Reported on 05/28/2017)  . budesonide-formoterol (SYMBICORT) 160-4.5 MCG/ACT inhaler Inhale 2 puffs into the lungs 2 (two) times daily. (Patient not taking: Reported on 04/04/2017)  . Mometasone Furoate (ASMANEX 30 METERED DOSES) 110 MCG/INH AEPB 2 puffs daily (Patient not taking: Reported on 05/28/2017)  . predniSONE (DELTASONE) 10 MG tablet 4 tabs for 2 days, then 3 tabs for 2 days, 2 tabs for 2 days, then 1 tab for 2 days, then stop   No facility-administered encounter medications on file as of 05/28/2017.      Review of Systems  Constitutional:   No  weight loss, night sweats,  Fevers, chills, fatigue, or  lassitude.  HEENT:   No headaches,  Difficulty swallowing,  Tooth/dental problems, or  Sore throat,                No sneezing, itching, ear ache, nasal congestion, post nasal drip,   CV:  No chest pain,  Orthopnea, PND, swelling in lower extremities, anasarca, dizziness, palpitations, syncope.   GI  No heartburn, indigestion, abdominal pain, nausea, vomiting, diarrhea, change in bowel habits, loss of appetite, bloody stools.   Resp: No shortness of breath with exertion or at rest.  No excess mucus, no productive cough,  No non-productive cough,  No coughing up of  blood.  No change in color of mucus.  No wheezing.  No chest wall deformity  Skin: no rash or lesions.  GU: no dysuria, change in color of urine, no urgency or frequency.  No flank pain, no hematuria   MS:  No joint pain or swelling.  No decreased range of motion.  No back pain.    Physical Exam  BP 128/88 (BP Location: Left Arm, Cuff Size: Normal)   Pulse 87   Ht 5' 3.75" (1.619 m)   Wt 194 lb 9.6 oz (88.3 kg)   SpO2 96%   BMI 33.67 kg/m   GEN: A/Ox3; pleasant , NAD, well nourished    HEENT:  [place]/AT,  EACs-clear, TMs-wnl, NOSE-clear, THROAT-clear, no lesions, no postnasal drip or exudate noted.    NECK:  Supple w/ fair ROM; no JVD; normal carotid impulses w/o bruits; no thyromegaly or nodules palpated; no lymphadenopathy.    RESP  Few trace exp wheezes on forced exp.  no accessory muscle use, no dullness to percussion  CARD:  RRR, no m/r/g, no peripheral edema, pulses intact, no cyanosis or clubbing.  GI:   Soft & nt; nml bowel sounds; no organomegaly or masses detected.   Musco: Warm bil, no deformities or joint swelling noted.   Neuro: alert, no focal deficits noted.    Skin: Warm, no lesions or rashes    Lab Results:  CBC  BNP No results found for: BNP  ProBNP No results found for: PROBNP  Imaging: No results found.   Assessment & Plan:   Asthma with acute exacerbation Flare with AR  Asthma education with action plan   Plan  Patient Instructions  Prednisone taper over next week.  Begin Zyrtec 10mg  At bedtime   Begin BREO 1 puff daily , rinse after use.  Use Albuterol As needed  Every 4hr for wheezing .  Follow up with Dr. Vassie Loll  In 2-3 months and As needed   Please contact office for sooner follow up if symptoms do not improve or worsen or seek emergency care       Allergic rhinitis Add zyrtec .       Rubye Oaks, NP 05/28/2017

## 2017-05-28 NOTE — Addendum Note (Signed)
Addended by: Boone MasterJONES, JESSICA E on: 05/28/2017 12:54 PM   Modules accepted: Orders

## 2017-05-28 NOTE — Assessment & Plan Note (Signed)
Add zyrtec

## 2017-05-28 NOTE — Progress Notes (Signed)
Patient seen in the office today and instructed on use of Breo 100.  Patient expressed understanding and demonstrated technique. Boone MasterJessica Yanni Quiroa, [facility] 05/28/17

## 2017-05-28 NOTE — Telephone Encounter (Signed)
Spoke with patient. She stated that she has been having issues with increased coughing and wheezing.   Patient has been scheduled with TP at noon.   Nothing else needed at time of call.

## 2017-05-28 NOTE — Assessment & Plan Note (Signed)
Flare with AR  Asthma education with action plan   Plan  Patient Instructions  Prednisone taper over next week.  Begin Zyrtec 10mg  At bedtime   Begin BREO 1 puff daily , rinse after use.  Use Albuterol As needed  Every 4hr for wheezing .  Follow up with Dr. Vassie LollAlva  In 2-3 months and As needed   Please contact office for sooner follow up if symptoms do not improve or worsen or seek emergency care

## 2017-05-28 NOTE — Patient Instructions (Signed)
Prednisone taper over next week.  Begin Zyrtec 10mg  At bedtime   Begin BREO 1 puff daily , rinse after use.  Use Albuterol As needed  Every 4hr for wheezing .  Follow up with Dr. Vassie LollAlva  In 2-3 months and As needed   Please contact office for sooner follow up if symptoms do not improve or worsen or seek emergency care

## 2017-07-11 ENCOUNTER — Ambulatory Visit: Payer: 59 | Admitting: Adult Health

## 2017-07-15 ENCOUNTER — Encounter: Payer: Self-pay | Admitting: Adult Health

## 2017-07-15 ENCOUNTER — Ambulatory Visit: Payer: 59 | Admitting: Adult Health

## 2017-07-15 DIAGNOSIS — J4530 Mild persistent asthma, uncomplicated: Secondary | ICD-10-CM | POA: Diagnosis not present

## 2017-07-15 DIAGNOSIS — J301 Allergic rhinitis due to pollen: Secondary | ICD-10-CM

## 2017-07-15 NOTE — Assessment & Plan Note (Signed)
Improved control   Plan  Patient Instructions  Continue on Zyrtec 10mg  At bedtime As needed  Drainage   Continue on BREO 1 puff daily , rinse after use.  Use Albuterol As needed  Every 4hr for wheezing .  Follow up with Dr. Vassie LollAlva  In 3-4 months and As needed   Please contact office for sooner follow up if symptoms do not improve or worsen or seek emergency care

## 2017-07-15 NOTE — Patient Instructions (Signed)
Continue on Zyrtec 10mg  At bedtime As needed  Drainage   Continue on BREO 1 puff daily , rinse after use.  Use Albuterol As needed  Every 4hr for wheezing .  Follow up with Dr. Vassie LollAlva  In 3-4 months and As needed   Please contact office for sooner follow up if symptoms do not improve or worsen or seek emergency care

## 2017-07-15 NOTE — Assessment & Plan Note (Signed)
Improved Symptom control on BREO  Control for triggers   Plan  Patient Instructions  Continue on Zyrtec 10mg  At bedtime As needed  Drainage   Continue on BREO 1 puff daily , rinse after use.  Use Albuterol As needed  Every 4hr for wheezing .  Follow up with Dr. Vassie LollAlva  In 3-4 months and As needed   Please contact office for sooner follow up if symptoms do not improve or worsen or seek emergency care

## 2017-07-15 NOTE — Progress Notes (Signed)
 @Patient  ID: Madeline Gomez, female    DOB: 1980-03-15, 37 y.o.   MRN: [id number]  Chief Complaint  Patient presents with  . Follow-up    Asthma     Referring provider: Dorothyann PengSanders, Robyn, MD  HPI: 37 year old female never smoker followed for chronic asthma  Significant tests/ events  Spirometry 11/2012 - FEv1 77%, FVC 101%, ratio 65 s/o mild obstruction   08/30/2013 Spirometry >>moderate airway obstruction with FEV1 65%, FVC 86% and ratio 65  07/15/2017 Follow up : Asthma  Patient presents for a 7158-month follow-up.  Last visit was having increased asthmatic symptoms.  She was changed from Asmanex to [place].  Feels this is really helped.  Breathing is better with decreased shortness of breath and wheezing.  She had no albuterol use.  Does tell me that she uses herbal product called Collodial Silver.  That she can use as a topical, ingestion and inhaled product.  I advised her that I am not familiar with this product but did feel that inhaling this with her underlying asthma would be recommended.    Allergies  Allergen Reactions  . Other     PT IS ALLERGIC TO GRASS, CATS, TOMATOES, PINEAPPLES, POLLEN  . Percocet [Oxycodone-Acetaminophen] Itching  . Prednisone Itching    Rapid heart rate, made me feel bad    There is no immunization history for the selected administration types on file for this patient.  Past Medical History:  Diagnosis Date  . Anemia   . Asthma   . Broken bones    RIGHT FOOT   . BV (bacterial vaginosis)    RECURRENT  . H/O vitamin D deficiency   . Thyroid disease     Tobacco History: Social History   Tobacco Use  Smoking Status Never Smoker  Smokeless Tobacco Never Used   Counseling given: Not Answered   Outpatient Encounter Medications as of 07/15/2017  Medication Sig  . albuterol (ACCUNEB) 0.63 MG/3ML nebulizer solution Take 1 ampule by nebulization every 6 (six) hours as needed for wheezing.  Marland Kitchen. albuterol (PROVENTIL HFA;VENTOLIN HFA)  108 (90 Base) MCG/ACT inhaler Inhale 2 puffs into the lungs every 4 (four) hours as needed for wheezing.  . fluticasone furoate-vilanterol (BREO ELLIPTA) 100-25 MCG/INH AEPB Inhale 1 puff into the lungs daily.  . [DISCONTINUED] budesonide-formoterol (SYMBICORT) 160-4.5 MCG/ACT inhaler Inhale 2 puffs into the lungs 2 (two) times daily. (Patient not taking: Reported on 04/04/2017)  . [DISCONTINUED] fluticasone furoate-vilanterol (BREO ELLIPTA) 100-25 MCG/INH AEPB Inhale 1 puff into the lungs daily. (Patient not taking: Reported on 07/15/2017)  . [DISCONTINUED] Mometasone Furoate (ASMANEX 30 METERED DOSES) 110 MCG/INH AEPB 2 puffs daily (Patient not taking: Reported on 05/28/2017)  . [DISCONTINUED] predniSONE (DELTASONE) 10 MG tablet 4 tabs for 2 days, then 3 tabs for 2 days, 2 tabs for 2 days, then 1 tab for 2 days, then stop (Patient not taking: Reported on 07/15/2017)   No facility-administered encounter medications on file as of 07/15/2017.      Review of Systems  Constitutional:   No  weight loss, night sweats,  Fevers, chills, fatigue, or  lassitude.  HEENT:   No headaches,  Difficulty swallowing,  Tooth/dental problems, or  Sore throat,                No sneezing, itching, ear ache, nasal congestion, post nasal drip,   CV:  No chest pain,  Orthopnea, PND, swelling in lower extremities, anasarca, dizziness, palpitations, syncope.   GI  No heartburn, indigestion, abdominal  pain, nausea, vomiting, diarrhea, change in bowel habits, loss of appetite, bloody stools.   Resp: No shortness of breath with exertion or at rest.  No excess mucus, no productive cough,  No non-productive cough,  No coughing up of blood.  No change in color of mucus.  No wheezing.  No chest wall deformity  Skin: no rash or lesions.  GU: no dysuria, change in color of urine, no urgency or frequency.  No flank pain, no hematuria   MS:  No joint pain or swelling.  No decreased range of motion.  No back pain.    Physical  Exam  BP 122/80 (BP Location: Left Arm, Cuff Size: Normal)   Pulse 84   Ht 5' 0.75" (1.543 m)   Wt 201 lb 12.8 oz (91.5 kg)   SpO2 96%   BMI 38.44 kg/m   GEN: A/Ox3; pleasant , NAD, obese    HEENT:  [place]/AT,  EACs-clear, TMs-wnl, NOSE-clear, THROAT-clear, no lesions, no postnasal drip or exudate noted.   NECK:  Supple w/ fair ROM; no JVD; normal carotid impulses w/o bruits; no thyromegaly or nodules palpated; no lymphadenopathy.    RESP  Clear  P & A; w/o, wheezes/ rales/ or rhonchi. no accessory muscle use, no dullness to percussion  CARD:  RRR, no m/r/g, no peripheral edema, pulses intact, no cyanosis or clubbing.  GI:   Soft & nt; nml bowel sounds; no organomegaly or masses detected.   Musco: Warm bil, no deformities or joint swelling noted.   Neuro: alert, no focal deficits noted.    Skin: Warm, no lesions or rashes    Lab Results:  CBC  BMET  BNP No results found for: BNP  ProBNP No results found for: PROBNP  Imaging: No results found.   Assessment & Plan:   Asthma, chronic Improved Symptom control on BREO  Control for triggers   Plan  Patient Instructions  Continue on Zyrtec 10mg  At bedtime As needed  Drainage   Continue on BREO 1 puff daily , rinse after use.  Use Albuterol As needed  Every 4hr for wheezing .  Follow up with Dr. Vassie Loll  In 3-4 months and As needed   Please contact office for sooner follow up if symptoms do not improve or worsen or seek emergency care       Allergic rhinitis Improved control   Plan  Patient Instructions  Continue on Zyrtec 10mg  At bedtime As needed  Drainage   Continue on BREO 1 puff daily , rinse after use.  Use Albuterol As needed  Every 4hr for wheezing .  Follow up with Dr. Vassie Loll  In 3-4 months and As needed   Please contact office for sooner follow up if symptoms do not improve or worsen or seek emergency care          Rubye Oaks, NP 07/15/2017

## 2017-10-29 ENCOUNTER — Ambulatory Visit: Payer: 59 | Admitting: Pulmonary Disease

## 2017-10-29 ENCOUNTER — Encounter: Payer: Self-pay | Admitting: Pulmonary Disease

## 2017-10-29 VITALS — BP 122/76 | HR 88 | Ht 63.75 in | Wt 207.0 lb

## 2017-10-29 DIAGNOSIS — J45998 Other asthma: Secondary | ICD-10-CM | POA: Diagnosis not present

## 2017-10-29 MED ORDER — FLUTICASONE FUROATE-VILANTEROL 100-25 MCG/INH IN AEPB: 1.0000 | INHALATION_SPRAY | Freq: Every day | RESPIRATORY_TRACT | 5 refills | Status: DC

## 2017-10-29 NOTE — Assessment & Plan Note (Signed)
Continue Zyrtec especially spring and fall

## 2017-10-29 NOTE — Patient Instructions (Signed)
Best strategy would be to use Breo once daily in the months of spring and fall

## 2017-10-29 NOTE — Assessment & Plan Note (Signed)
Best strategy would be to use Breo once daily in the months of spring and fall Other months, okay to use albuterol on a as needed basis

## 2017-10-29 NOTE — Progress Notes (Signed)
   Subjective:    Patient ID: Madeline Gomez, female    DOB: 19-Nov-1980, 37 y.o.   MRN: [id number]  HPI  37 year old female never smoker followed for chronic asthma  Onset around her first pregnancy in 2003   Chief Complaint  Patient presents with  . Follow-up    pt states she is doing well, not currently taking breo daily d/t coupon card not being honored by pharmacy.     Symptoms are worse in the spring and fall. She is on Zyrtec daily.  She was given Asmanex after her visit with me in 04/2017 and then escalated to [facility] although she had never taken the Asmanex.  She was unable to fill the coupon of Breo but really like the sample that she got from the office especially because this is only once daily. She reports occasional nocturnal symptoms. Has not really used her rescue inhaler much. No exacerbations or hospital visits  Does not want a flu shot today   Significant tests/ events  Spirometry 11/2012 - FEv1 77%, FVC 101%, ratio 65 s/o mild obstruction   08/30/2013 Spirometry >>moderate airway obstruction with FEV1 65%, FVC 86% and ratio 65  Spirometry 04/2017 mild airway obstruction with ratio 71, FEV1 of 80% and FVC of 94% Review of Systems Patient denies significant dyspnea,cough, hemoptysis,  chest pain, palpitations, pedal edema, orthopnea, paroxysmal nocturnal dyspnea, lightheadedness, nausea, vomiting, abdominal or  leg pains      Objective:   Physical Exam  Gen. Pleasant, obese, in no distress ENT - no lesions, no post nasal drip Neck: No JVD, no thyromegaly, no carotid bruits Lungs: no use of accessory muscles, no dullness to percussion, decreased without rales or rhonchi  Cardiovascular: Rhythm regular, heart sounds  normal, no murmurs or gallops, no peripheral edema Musculoskeletal: No deformities, no cyanosis or clubbing , no tremors        Assessment & Plan:

## 2018-04-27 ENCOUNTER — Ambulatory Visit: Payer: 59 | Admitting: Adult Health

## 2018-06-01 ENCOUNTER — Telehealth: Payer: Self-pay | Admitting: Pulmonary Disease

## 2018-06-01 DIAGNOSIS — J45998 Other asthma: Secondary | ICD-10-CM

## 2018-06-01 MED ORDER — ALBUTEROL SULFATE HFA 108 (90 BASE) MCG/ACT IN AERS: 2.0000 | INHALATION_SPRAY | RESPIRATORY_TRACT | 3 refills | Status: DC | PRN

## 2018-06-01 NOTE — Telephone Encounter (Signed)
Checked last office note dated 10/29/17, patient was to use albuterol inhaler as needed.  Prescription sent to pharmacy. Called the patient to advise.  Nothing further needed at this time.

## 2019-10-15 DIAGNOSIS — B977 Papillomavirus as the cause of diseases classified elsewhere: Secondary | ICD-10-CM | POA: Diagnosis not present

## 2019-10-15 DIAGNOSIS — Z6834 Body mass index (BMI) 34.0-34.9, adult: Secondary | ICD-10-CM | POA: Diagnosis not present

## 2019-10-15 DIAGNOSIS — Z139 Encounter for screening, unspecified: Secondary | ICD-10-CM | POA: Diagnosis not present

## 2019-10-15 DIAGNOSIS — Z124 Encounter for screening for malignant neoplasm of cervix: Secondary | ICD-10-CM | POA: Diagnosis not present

## 2019-10-15 DIAGNOSIS — Z1231 Encounter for screening mammogram for malignant neoplasm of breast: Secondary | ICD-10-CM | POA: Diagnosis not present

## 2019-10-15 DIAGNOSIS — Z113 Encounter for screening for infections with a predominantly sexual mode of transmission: Secondary | ICD-10-CM | POA: Diagnosis not present

## 2019-10-15 DIAGNOSIS — Z01411 Encounter for gynecological examination (general) (routine) with abnormal findings: Secondary | ICD-10-CM | POA: Diagnosis not present

## 2019-10-15 DIAGNOSIS — N898 Other specified noninflammatory disorders of vagina: Secondary | ICD-10-CM | POA: Diagnosis not present

## 2019-10-15 DIAGNOSIS — N760 Acute vaginitis: Secondary | ICD-10-CM | POA: Diagnosis not present

## 2019-11-18 DIAGNOSIS — R928 Other abnormal and inconclusive findings on diagnostic imaging of breast: Secondary | ICD-10-CM | POA: Diagnosis not present

## 2019-11-22 ENCOUNTER — Other Ambulatory Visit: Payer: Self-pay | Admitting: Internal Medicine

## 2019-11-22 DIAGNOSIS — R928 Other abnormal and inconclusive findings on diagnostic imaging of breast: Secondary | ICD-10-CM | POA: Diagnosis not present

## 2019-11-22 DIAGNOSIS — E042 Nontoxic multinodular goiter: Secondary | ICD-10-CM | POA: Diagnosis not present

## 2019-11-22 DIAGNOSIS — Z808 Family history of malignant neoplasm of other organs or systems: Secondary | ICD-10-CM

## 2019-11-22 DIAGNOSIS — R946 Abnormal results of thyroid function studies: Secondary | ICD-10-CM | POA: Diagnosis not present

## 2019-12-02 ENCOUNTER — Ambulatory Visit
Admission: RE | Admit: 2019-12-02 | Discharge: 2019-12-02 | Disposition: A | Discharge status: Home / Self Care | Payer: No Typology Code available for payment source | Source: Ambulatory Visit | Attending: Internal Medicine | Admitting: Internal Medicine

## 2019-12-02 DIAGNOSIS — E041 Nontoxic single thyroid nodule: Secondary | ICD-10-CM | POA: Diagnosis not present

## 2019-12-02 DIAGNOSIS — E042 Nontoxic multinodular goiter: Secondary | ICD-10-CM

## 2019-12-02 DIAGNOSIS — Z808 Family history of malignant neoplasm of other organs or systems: Secondary | ICD-10-CM

## 2020-02-17 ENCOUNTER — Telehealth: Payer: Self-pay | Admitting: Pulmonary Disease

## 2020-02-17 DIAGNOSIS — J45998 Other asthma: Secondary | ICD-10-CM

## 2020-02-17 DIAGNOSIS — Z20828 Contact with and (suspected) exposure to other viral communicable diseases: Secondary | ICD-10-CM | POA: Diagnosis not present

## 2020-02-17 DIAGNOSIS — U071 COVID-19: Secondary | ICD-10-CM | POA: Diagnosis not present

## 2020-02-17 MED ORDER — ALBUTEROL SULFATE HFA 108 (90 BASE) MCG/ACT IN AERS: 2.0000 | INHALATION_SPRAY | RESPIRATORY_TRACT | 0 refills | Status: DC | PRN

## 2020-02-17 NOTE — Telephone Encounter (Signed)
Spoke with the pt and notified of response per Dr Vassie Loll  She verbalized understanding  Rx sent for albuterol

## 2020-02-17 NOTE — Telephone Encounter (Signed)
Spoke with the pt  She started with sore throat on 02/08/20 Her boyfriend tested positive for covid after that  She went and had rapid test today and tested pos  She is c/o wheezing and DOE with walking up steps, has some mild pressure in her chest and cough with white sputum  She reports no f/c/s, aches  Feels fatigued  Loss of smell  Has NOT been vaccinated  She is out of range for infusion since symptoms started > 7 days ago  She is no longer on any inhalers bc she has not had flare in 2 years of her asthma  Dr Vassie Loll, can you please advise if you want to call her in anything? I advised may not be able since last seen 2019 but pt asked that we check with MD  Please advise, thanks!

## 2020-02-17 NOTE — Telephone Encounter (Signed)
Albuterol MDI 2 puffs every 6 hours as needed for wheezing. Plenty of oral fluids. Obtain pulse oximeter and monitor saturations and to emergency room if less than 90%

## 2020-03-20 ENCOUNTER — Telehealth: Payer: Self-pay | Admitting: Pulmonary Disease

## 2020-03-20 DIAGNOSIS — J45998 Other asthma: Secondary | ICD-10-CM

## 2020-03-20 MED ORDER — ALBUTEROL SULFATE HFA 108 (90 BASE) MCG/ACT IN AERS: 2.0000 | INHALATION_SPRAY | RESPIRATORY_TRACT | 0 refills | Status: DC | PRN

## 2020-03-20 NOTE — Telephone Encounter (Signed)
Spoke with pt. She is requesting a refill for Albuterol inhaler. Pt was last seen 10/29/17. Pt has an appt 03/23/20 with Rubye Oaks, NP.  PT states she has been having increased diff breathing since having Covid last month. Please advise if ok to refill Albuterol until appt 03/23/20.

## 2020-03-20 NOTE — Telephone Encounter (Signed)
Left message on machine per pt request that albuterol refill was sent to pharmacy. Nothing further needed.

## 2020-03-20 NOTE — Telephone Encounter (Signed)
Yes that is fine to refill x 1 only . Keep ov as planned Please contact office for sooner follow up if symptoms do not improve or worsen or seek emergency care

## 2020-03-23 ENCOUNTER — Encounter: Payer: Self-pay | Admitting: *Deleted

## 2020-03-23 ENCOUNTER — Ambulatory Visit (INDEPENDENT_AMBULATORY_CARE_PROVIDER_SITE_OTHER): Payer: BC Managed Care – PPO | Admitting: Adult Health

## 2020-03-23 ENCOUNTER — Telehealth: Payer: Self-pay | Admitting: Adult Health

## 2020-03-23 ENCOUNTER — Other Ambulatory Visit: Payer: Self-pay

## 2020-03-23 ENCOUNTER — Encounter: Payer: Self-pay | Admitting: Adult Health

## 2020-03-23 ENCOUNTER — Ambulatory Visit (INDEPENDENT_AMBULATORY_CARE_PROVIDER_SITE_OTHER): Payer: BC Managed Care – PPO

## 2020-03-23 VITALS — BP 124/90 | HR 89 | Temp 97.3°F | Ht 63.25 in | Wt 215.8 lb

## 2020-03-23 DIAGNOSIS — J309 Allergic rhinitis, unspecified: Secondary | ICD-10-CM

## 2020-03-23 DIAGNOSIS — J45998 Other asthma: Secondary | ICD-10-CM | POA: Diagnosis not present

## 2020-03-23 DIAGNOSIS — J45909 Unspecified asthma, uncomplicated: Secondary | ICD-10-CM | POA: Diagnosis not present

## 2020-03-23 DIAGNOSIS — U071 COVID-19: Secondary | ICD-10-CM | POA: Diagnosis not present

## 2020-03-23 DIAGNOSIS — J4520 Mild intermittent asthma, uncomplicated: Secondary | ICD-10-CM

## 2020-03-23 DIAGNOSIS — J4531 Mild persistent asthma with (acute) exacerbation: Secondary | ICD-10-CM

## 2020-03-23 MED ORDER — BREO ELLIPTA 100-25 MCG/INH IN AEPB: 1.0000 | INHALATION_SPRAY | Freq: Every day | RESPIRATORY_TRACT | 0 refills | Status: DC

## 2020-03-23 MED ORDER — AZITHROMYCIN 250 MG PO TABS: ORAL_TABLET | ORAL | 0 refills | Status: AC

## 2020-03-23 MED ORDER — BENZONATATE 200 MG PO CAPS: 200.0000 mg | ORAL_CAPSULE | Freq: Three times a day (TID) | ORAL | 1 refills | Status: DC | PRN

## 2020-03-23 MED ORDER — ALBUTEROL SULFATE 0.63 MG/3ML IN NEBU: 1.0000 | INHALATION_SOLUTION | Freq: Four times a day (QID) | RESPIRATORY_TRACT | 1 refills | Status: DC | PRN

## 2020-03-23 MED ORDER — FLUTICASONE FUROATE-VILANTEROL 100-25 MCG/INH IN AEPB: 1.0000 | INHALATION_SPRAY | Freq: Every day | RESPIRATORY_TRACT | Status: DC

## 2020-03-23 MED ORDER — ALBUTEROL SULFATE HFA 108 (90 BASE) MCG/ACT IN AERS: 2.0000 | INHALATION_SPRAY | RESPIRATORY_TRACT | 3 refills | Status: DC | PRN

## 2020-03-23 MED ORDER — PREDNISONE 20 MG PO TABS: 20.0000 mg | ORAL_TABLET | Freq: Every day | ORAL | 0 refills | Status: DC

## 2020-03-23 NOTE — Assessment & Plan Note (Signed)
Flare with recent covid 19 she has been off of her maintenance medicine.  Unable to tolerate higher dose of prednisone.  Will restart Breo.  Treat with antibiotics and short course of low-dose prednisone.  Check chest x-ray today.  Plan  Patient Instructions  Chest xray today .  Zpack take as directed.  Prednisone 20mg  daily for 5 days take with food .  Continue on Zyrtec 10mg  At bedtime As needed  Drainage   Restart  BREO 1 puff daily , rinse after use.  Mucinex DM Twice daily  As needed  Cough .  Tessalon Three times a day  As needed  Cough  Use Albuterol inhaler /Neb As needed  Every  6 hr for wheezing .  Follow up with Dr.  In 4-6 weeks and As needed   Please contact office for sooner follow up if symptoms do not improve or worsen or seek emergency care

## 2020-03-23 NOTE — Progress Notes (Signed)
@Patient  ID: , female    DOB: 1980-12-28, 40 y.o.   MRN: [id number]  Chief Complaint  Patient presents with  . Follow-up  . Asthma    Referring provider: [id number], MD  HPI: 40 year old female never smoker followed for chronic asthma  TEST/EVENTS :   Spirometry 11/2012 - FEv1 77%, FVC 101%, ratio 65 s/o mild obstruction   08/30/2013 Spirometry >>moderate airway obstruction with FEV1 65%, FVC 86% and ratio 65  Spirometry 04/2017 mild airway obstruction with ratio 71, FEV1 of 80% and FVC of 94%  03/23/2020 Follow up : Asthma  Patient presents for a follow-up visit.  Last seen September 2019.  Patient says overall breathing has been doing well until 1 month ago when she got Covid 19 .  She had been off her BREO for >1 year. Tested positive for Covid 1 month ago, developed cough, congestion , wheezing and increased dyspnea. Still coughing up thick mucus . Feels very tight . Has albuterol inhaler. Lost neb machine.  No fever, chest pain , orthopnea , edema or hemoptysis. Appetite is good.  Cough is keeping her up at night . Wants something to help with cough.  Has not been vaccinated.       Allergies  Allergen Reactions  . Other     PT IS ALLERGIC TO GRASS, CATS, TOMATOES, PINEAPPLES, POLLEN  . Percocet [Oxycodone-Acetaminophen] Itching  . Prednisone Itching    Rapid heart rate, made me feel bad    There is no immunization history for the selected administration types on file for this patient.  Past Medical History:  Diagnosis Date  . Anemia   . Asthma   . Broken bones    RIGHT FOOT   . BV (bacterial vaginosis)    RECURRENT  . H/O vitamin D deficiency   . Thyroid disease     Tobacco History: Social History   Tobacco Use  Smoking Status Never Smoker  Smokeless Tobacco Never Used   Counseling given: Not Answered   Outpatient Medications Prior to Visit  Medication Sig Dispense Refill  . albuterol (VENTOLIN HFA) 108 (90 Base) MCG/ACT  inhaler Inhale 2 puffs into the lungs every 4 (four) hours as needed for wheezing. 1 each 0  . cetirizine (ZYRTEC) 10 MG tablet Take 10 mg by mouth daily. (Patient not taking: Reported on 03/23/2020)    . fluticasone furoate-vilanterol (BREO ELLIPTA) 100-25 MCG/INH AEPB Inhale 1 puff into the lungs daily. (Patient not taking: Reported on 03/23/2020) 60 each 5  . OVER THE COUNTER MEDICATION daily. OTC immune support    . albuterol (ACCUNEB) 0.63 MG/3ML nebulizer solution Take 1 ampule by nebulization every 6 (six) hours as needed for wheezing. (Patient not taking: Reported on 03/23/2020)     No facility-administered medications prior to visit.     Review of Systems:   Constitutional:   No  weight loss, night sweats,  Fevers, chills, fatigue, or  lassitude.  HEENT:   No headaches,  Difficulty swallowing,  Tooth/dental problems, or  Sore throat,                No sneezing, itching, ear ache, + nasal congestion, post nasal drip,   CV:  No chest pain,  Orthopnea, PND, swelling in lower extremities, anasarca, dizziness, palpitations, syncope.   GI  No heartburn, indigestion, abdominal pain, nausea, vomiting, diarrhea, change in bowel habits, loss of appetite, bloody stools.   Resp:   No chest wall deformity  Skin: no rash or  lesions.  GU: no dysuria, change in color of urine, no urgency or frequency.  No flank pain, no hematuria   MS:  No joint pain or swelling.  No decreased range of motion.  No back pain.    Physical Exam  BP 124/90 (BP Location: Left Arm, Patient Position: Sitting, Cuff Size: Large)   Pulse 89   Temp (!) 97.3 F (36.3 C) (Temporal)   Ht 5' 3.25" (1.607 m)   Wt 215 lb 12.8 oz (97.9 kg)   SpO2 100%   BMI 37.93 kg/m   GEN: A/Ox3; pleasant , NAD, well nourished    HEENT:  [place]/AT,  NOSE-clear, THROAT-clear, no lesions, no postnasal drip or exudate noted.   NECK:  Supple w/ fair ROM; no JVD; normal carotid impulses w/o bruits; no thyromegaly or nodules palpated; no  lymphadenopathy.    RESP  Exp wheezing ,   no accessory muscle use, no dullness to percussion  CARD:  RRR, no m/r/g, no peripheral edema, pulses intact, no cyanosis or clubbing.  GI:   Soft & nt; nml bowel sounds; no organomegaly or masses detected.   Musco: Warm bil, no deformities or joint swelling noted.   Neuro: alert, no focal deficits noted.    Skin: Warm, no lesions or rashes    Lab Results:    BNP No results found for: BNP  ProBNP No results found for: PROBNP  Imaging: DG Chest 2 View  Result Date: 03/23/2020 CLINICAL DATA:  Asthma, recent COVID-19 infection EXAM: CHEST - 2 VIEW COMPARISON:  05/27/2014 FINDINGS: Normal heart size, mediastinal contours, and pulmonary vascularity. Lungs clear. No pulmonary infiltrate, pleural effusion, or pneumothorax. Osseous structures unremarkable. IMPRESSION: Normal exam. Electronically Signed   By: Ulyses Southward M.D.   On: 03/23/2020 11:42      No flowsheet data found.  No results found for: NITRICOXIDE      Assessment & Plan:   Asthma, chronic Flare with recent covid 19 she has been off of her maintenance medicine.  Unable to tolerate higher dose of prednisone.  Will restart Breo.  Treat with antibiotics and short course of low-dose prednisone.  Check chest x-ray today.  Plan  Patient Instructions  Chest xray today .  Zpack take as directed.  Prednisone 20mg  daily for 5 days take with food .  Continue on Zyrtec 10mg  At bedtime As needed  Drainage   Restart  BREO 1 puff daily , rinse after use.  Mucinex DM Twice daily  As needed  Cough .  Tessalon Three times a day  As needed  Cough  Use Albuterol inhaler /Neb As needed  Every  6 hr for wheezing .  Follow up with Dr.  In 4-6 weeks and As needed   Please contact office for sooner follow up if symptoms do not improve or worsen or seek emergency care       Allergic rhinitis Control for triggers.  Continue on current regimen     ,  NP 03/23/2020

## 2020-03-23 NOTE — Telephone Encounter (Signed)
I have called and spoke with pt and she is aware of refill for the albuterol neb meds has been sent to the pharmacy per pt.  Nothing further is needed.

## 2020-03-23 NOTE — Assessment & Plan Note (Signed)
Control for triggers.  Continue on current regimen

## 2020-03-23 NOTE — Patient Instructions (Addendum)
Chest xray today .  Zpack take as directed.  Prednisone 20mg  daily for 5 days take with food .  Continue on Zyrtec 10mg  At bedtime As needed  Drainage   Restart  BREO 1 puff daily , rinse after use.  Mucinex DM Twice daily  As needed  Cough .  Tessalon Three times a day  As needed  Cough  Use Albuterol inhaler /Neb As needed  Every  6 hr for wheezing .  Follow up with Dr.  In 4-6 weeks and As needed   Please contact office for sooner follow up if symptoms do not improve or worsen or seek emergency care

## 2020-03-23 NOTE — Telephone Encounter (Signed)
TP pt was seen by you today and wanted to see if we could send in her nebulizer medications.  She stated that she is using the inhaler every hour and the neb meds would help more.  Can we send in an order for a new neb machine?  Please advise. Thanks

## 2020-03-23 NOTE — Telephone Encounter (Signed)
Yes can refill albuterol

## 2020-04-27 ENCOUNTER — Other Ambulatory Visit: Payer: Self-pay

## 2020-04-27 ENCOUNTER — Encounter: Payer: Self-pay | Admitting: Pulmonary Disease

## 2020-04-27 ENCOUNTER — Ambulatory Visit (INDEPENDENT_AMBULATORY_CARE_PROVIDER_SITE_OTHER): Payer: BC Managed Care – PPO | Admitting: Pulmonary Disease

## 2020-04-27 DIAGNOSIS — J302 Other seasonal allergic rhinitis: Secondary | ICD-10-CM | POA: Diagnosis not present

## 2020-04-27 DIAGNOSIS — J4531 Mild persistent asthma with (acute) exacerbation: Secondary | ICD-10-CM

## 2020-04-27 MED ORDER — BREO ELLIPTA 100-25 MCG/INH IN AEPB: 1.0000 | INHALATION_SPRAY | Freq: Every day | RESPIRATORY_TRACT | 5 refills | Status: DC

## 2020-04-27 NOTE — Progress Notes (Signed)
   Subjective:    Patient ID: Madeline Gomez, female    DOB: December 22, 1980, 40 y.o.   MRN: [id number]  HPI  40 year old never smoker for follow-up of chronic asthma, onset around 2003 with her first pregnancy -Worse with seasonal allergies  Last seen by me 10/2017 when we gave her Virgel Bouquet since this is once daily with a strategy of using this during spring and fall. She stopped using Breo 02/23/2018, developed Covid infection January 2022. Seen 03/2020 with an acute bronchitis and given Z-Pak and short course of 20 mg prednisone and placed back on Breo  She is now back to her baseline breathing wise.  Still taking samples of Breo, has not needed rescue albuterol MDI.  Use albuterol nebs once  Significant tests/ events reviewed  Spirometry 11/2012 - FEv1 77%, FVC 101%, ratio 65 s/o mild obstruction   08/30/2013 Spirometry >>moderate airway obstruction with FEV1 65%, FVC 86% and ratio 65  Spirometry 04/2017 mild airway obstruction with ratio 71, FEV1 of 80% and FVC of 94%  Review of Systems neg for any significant sore throat, dysphagia, itching, sneezing, nasal congestion or excess/ purulent secretions, fever, chills, sweats, unintended wt loss, pleuritic or exertional cp, hempoptysis, orthopnea pnd or change in chronic leg swelling. Also denies presyncope, palpitations, heartburn, abdominal pain, nausea, vomiting, diarrhea or change in bowel or urinary habits, dysuria,hematuria, rash, arthralgias, visual complaints, headache, numbness weakness or ataxia.     Objective:   Physical Exam   Gen. Pleasant, obese, in no distress ENT - no lesions, no post nasal drip Neck: No JVD, no thyromegaly, no carotid bruits Lungs: no use of accessory muscles, no dullness to percussion, decreased without rales or rhonchi  Cardiovascular: Rhythm regular, heart sounds  normal, no murmurs or gallops, no peripheral edema Musculoskeletal: No deformities, no cyanosis or clubbing , no tremors         Assessment & Plan:

## 2020-04-27 NOTE — Assessment & Plan Note (Signed)
Take Zyrtec daily during spring and fall

## 2020-04-27 NOTE — Assessment & Plan Note (Signed)
We will revert back to the same strategy of using Breo during spring and fall.  She can otherwise use albuterol as needed for mild intermittent symptoms. We will repeat spirometry in 1 to 2 months and if this shows persistent airway obstruction then that may mean that she has to take Breo all year long

## 2020-04-27 NOTE — Patient Instructions (Signed)
Refills on Breo x 5 Take zyrtec -spring & fall  Spirometry pre & post in 1 month

## 2020-05-15 ENCOUNTER — Other Ambulatory Visit: Payer: Self-pay

## 2020-05-15 DIAGNOSIS — J4531 Mild persistent asthma with (acute) exacerbation: Secondary | ICD-10-CM

## 2020-05-29 ENCOUNTER — Other Ambulatory Visit (HOSPITAL_COMMUNITY)
Admission: RE | Admit: 2020-05-29 | Discharge: 2020-05-29 | Disposition: A | Discharge status: Home / Self Care | Payer: BC Managed Care – PPO | Source: Ambulatory Visit | Attending: Pulmonary Disease | Admitting: Pulmonary Disease

## 2020-05-29 DIAGNOSIS — Z01812 Encounter for preprocedural laboratory examination: Secondary | ICD-10-CM | POA: Insufficient documentation

## 2020-05-29 DIAGNOSIS — Z20822 Contact with and (suspected) exposure to covid-19: Secondary | ICD-10-CM | POA: Insufficient documentation

## 2020-05-30 LAB — SARS CORONAVIRUS 2 (TAT 6-24 HRS): SARS Coronavirus 2: NEGATIVE

## 2020-06-01 ENCOUNTER — Other Ambulatory Visit: Payer: Self-pay

## 2020-06-01 ENCOUNTER — Ambulatory Visit (INDEPENDENT_AMBULATORY_CARE_PROVIDER_SITE_OTHER): Payer: No Typology Code available for payment source | Admitting: Pulmonary Disease

## 2020-06-01 DIAGNOSIS — J4531 Mild persistent asthma with (acute) exacerbation: Secondary | ICD-10-CM

## 2020-06-01 LAB — PULMONARY FUNCTION TEST
DL/VA % pred: 130 %
DL/VA: 5.81 ml/min/mmHg/L
DLCO cor % pred: 98 %
DLCO cor: 21.23 ml/min/mmHg
DLCO unc % pred: 98 %
DLCO unc: 21.23 ml/min/mmHg
FEF 25-75 Post: 1.97 L/sec
FEF 25-75 Pre: 1.45 L/sec
FEF2575-%Change-Post: 36 %
FEF2575-%Pred-Post: 71 %
FEF2575-%Pred-Pre: 52 %
FEV1-%Change-Post: 10 %
FEV1-%Pred-Post: 82 %
FEV1-%Pred-Pre: 74 %
FEV1-Post: 2.05 L
FEV1-Pre: 1.85 L
FEV1FVC-%Change-Post: 6 %
FEV1FVC-%Pred-Pre: 90 %
FEV6-%Change-Post: 2 %
FEV6-%Pred-Post: 85 %
FEV6-%Pred-Pre: 82 %
FEV6-Post: 2.54 L
FEV6-Pre: 2.46 L
FEV6FVC-%Pred-Post: 102 %
FEV6FVC-%Pred-Pre: 102 %
FVC-%Change-Post: 3 %
FVC-%Pred-Post: 84 %
FVC-%Pred-Pre: 81 %
FVC-Post: 2.55 L
FVC-Pre: 2.46 L
Post FEV1/FVC ratio: 80 %
Post FEV6/FVC ratio: 100 %
Pre FEV1/FVC ratio: 75 %
Pre FEV6/FVC Ratio: 100 %
RV % pred: 69 %
RV: 1.11 L
TLC % pred: 72 %
TLC: 3.62 L

## 2020-06-01 NOTE — Progress Notes (Signed)
Full PFT performed today. °

## 2020-06-01 NOTE — Patient Instructions (Signed)
Full PFT performed today. °

## 2020-11-02 ENCOUNTER — Telehealth: Payer: Self-pay | Admitting: Pulmonary Disease

## 2020-11-02 ENCOUNTER — Ambulatory Visit: Payer: BC Managed Care – PPO | Admitting: Adult Health

## 2020-11-02 ENCOUNTER — Encounter: Payer: Self-pay | Admitting: Adult Health

## 2020-11-02 ENCOUNTER — Other Ambulatory Visit: Payer: Self-pay

## 2020-11-02 DIAGNOSIS — J4531 Mild persistent asthma with (acute) exacerbation: Secondary | ICD-10-CM | POA: Diagnosis not present

## 2020-11-02 DIAGNOSIS — J302 Other seasonal allergic rhinitis: Secondary | ICD-10-CM | POA: Diagnosis not present

## 2020-11-02 NOTE — Patient Instructions (Addendum)
Continue on Zyrtec 10mg  At bedtime As needed  Drainage   Continue on BREO 1 puff daily , rinse after use.  Use Albuterol inhaler /Neb As needed  Every  6 hr for wheezing .  Follow up with Dr.  In 6 months and As needed   Please contact office for sooner follow up if symptoms do not improve or worsen or seek emergency care

## 2020-11-02 NOTE — Assessment & Plan Note (Signed)
Trigger prevention discussed.  Restart Zyrtec daily.  Plan  Patient Instructions  Continue on Zyrtec 10mg  At bedtime As needed  Drainage   Continue on BREO 1 puff daily , rinse after use.  Use Albuterol inhaler /Neb As needed  Every  6 hr for wheezing .  Follow up with Dr.  In 6 months and As needed   Please contact office for sooner follow up if symptoms do not improve or worsen or seek emergency care

## 2020-11-02 NOTE — Progress Notes (Signed)
@Patient  ID: , female    DOB: Jun 13, 1980, 40 y.o.   MRN: [id number]  Chief Complaint  Patient presents with   Follow-up    Referring provider: No ref. provider found  HPI: 40 year old female never smoker followed for chronic asthma Covid 19 infection 02/2020 +   TEST/EVENTS :  Spirometry  11/2012 - FEv1 77%, FVC 101%, ratio 65 s/o mild obstruction     08/30/2013 Spirometry >>moderate airway obstruction with FEV1 65%, FVC 86% and ratio 65   Spirometry 04/2017 mild airway obstruction with ratio 71, FEV1 of 80% and FVC of 94%  11/02/2020 Follow up : Asthma  Patient presents for a 20-month follow-up.  Patient has underlying asthma.  She says overall she has been doing okay.  For the last 3 months has not been taking her Breo on a regular basis.  However over the last week she did restart this as she noticed that the change in weather her asthma was not is doing as well.  She had a few times where she had some dry cough and wheezing.  She restarted Breo the last few days and has used her albuterol.  She denies any significant wheezing.  No discolored mucus or fever. She does have chronic allergies.  Has been taking Zyrtec.  Says she recently ran out of this.  We discussed restarting this.  Patient declines flu shot and COVID-vaccine.  Patient education given.  Says she is trying to be active.  She is working on weight loss.  Her weight did go up about 30 pounds this year.  We discussed healthy weight loss.  Allergies  Allergen Reactions   Other Other (See Comments)    PT IS ALLERGIC TO GRASS, CATS, TOMATOES, PINEAPPLES, POLLEN   Percocet [Oxycodone-Acetaminophen] Itching   Prednisone Itching    Rapid heart rate, made me feel bad    There is no immunization history for the selected administration types on file for this patient.  Past Medical History:  Diagnosis Date   Anemia    Asthma    Broken bones    RIGHT FOOT    BV (bacterial vaginosis)    RECURRENT   H/O  vitamin D deficiency    Thyroid disease     Tobacco History: Social History   Tobacco Use  Smoking Status Never  Smokeless Tobacco Never   Counseling given: Not Answered   Outpatient Medications Prior to Visit  Medication Sig Dispense Refill   albuterol (ACCUNEB) 0.63 MG/3ML nebulizer solution Take 3 mLs (0.63 mg total) by nebulization every 6 (six) hours as needed for wheezing. 120 mL 1   albuterol (VENTOLIN HFA) 108 (90 Base) MCG/ACT inhaler Inhale 2 puffs into the lungs every 4 (four) hours as needed for wheezing. 1 each 3   cetirizine (ZYRTEC) 10 MG tablet Take 10 mg by mouth daily.     fluticasone furoate-vilanterol (BREO ELLIPTA) 100-25 MCG/INH AEPB Inhale 1 puff into the lungs daily. 60 each 5   OVER THE COUNTER MEDICATION daily. OTC immune support     Facility-Administered Medications Prior to Visit  Medication Dose Route Frequency Provider Last Rate Last Admin   fluticasone furoate-vilanterol (BREO ELLIPTA) 100-25 MCG/INH 1 puff  1 puff Inhalation Daily Kayona Foor S, NP         Review of Systems:   Constitutional:   No  weight loss, night sweats,  Fevers, chills, fatigue, or  lassitude.  HEENT:   No headaches,  Difficulty swallowing,  Tooth/dental problems, or  Sore throat,                No sneezing, itching, ear ache,  +nasal congestion, post nasal drip,   CV:  No chest pain,  Orthopnea, PND, swelling in lower extremities, anasarca, dizziness, palpitations, syncope.   GI  No heartburn, indigestion, abdominal pain, nausea, vomiting, diarrhea, change in bowel habits, loss of appetite, bloody stools.   Resp:  No coughing up of blood.  No change in color of mucus.  No wheezing.  No chest wall deformity  Skin: no rash or lesions.  GU: no dysuria, change in color of urine, no urgency or frequency.  No flank pain, no hematuria   MS:  No joint pain or swelling.  No decreased range of motion.  No back pain.    Physical Exam  BP 120/74 (BP Location: Left Arm,  Patient Position: Sitting, Cuff Size: Large)   Pulse 88   Temp 98.3 F (36.8 C) (Oral)   Ht 5' 3.75" (1.619 m)   Wt 229 lb 9.6 oz (104.1 kg)   SpO2 96%   BMI 39.72 kg/m   GEN: A/Ox3; pleasant , NAD, obese    HEENT:  [place]/AT,  NOSE-clear, THROAT-clear, no lesions, no postnasal drip or exudate noted.   NECK:  Supple w/ fair ROM; no JVD; normal carotid impulses w/o bruits; no thyromegaly or nodules palpated; no lymphadenopathy.    RESP  Clear  P & A; w/o, wheezes/ rales/ or rhonchi. no accessory muscle use, no dullness to percussion  CARD:  RRR, no m/r/g, no peripheral edema, pulses intact, no cyanosis or clubbing.  GI:   Soft & nt; nml bowel sounds; no organomegaly or masses detected.   Musco: Warm bil, no deformities or joint swelling noted.   Neuro: alert, no focal deficits noted.    Skin: Warm, no lesions or rashes    Lab Results:   BNP No results found for: BNP  ProBNP No results found for: PROBNP  Imaging: No results found.    PFT Results Latest Ref Rng & Units 06/01/2020  FVC-Pre L 2.46  FVC-Predicted Pre % 81  FVC-Post L 2.55  FVC-Predicted Post % 84  Pre FEV1/FVC % % 75  Post FEV1/FCV % % 80  FEV1-Pre L 1.85  FEV1-Predicted Pre % 74  FEV1-Post L 2.05  DLCO uncorrected ml/min/mmHg 21.23  DLCO UNC% % 98  DLCO corrected ml/min/mmHg 21.23  DLCO COR %Predicted % 98  DLVA Predicted % 130  TLC L 3.62  TLC % Predicted % 72  RV % Predicted % 69    No results found for: NITRICOXIDE      Assessment & Plan:   Asthma, chronic Patient is continue on Breo daily.  Patient is encouraged on medication compliance. Continue on trigger prevention.  Asthma action plan discussed    Plan  Patient Instructions  Continue on Zyrtec 10mg  At bedtime As needed  Drainage   Continue on BREO 1 puff daily , rinse after use.  Use Albuterol inhaler /Neb As needed  Every  6 hr for wheezing .  Follow up with Dr.  In 6 months and As needed   Please contact office  for sooner follow up if symptoms do not improve or worsen or seek emergency care        Allergic rhinitis Trigger prevention discussed.  Restart Zyrtec daily.  Plan  Patient Instructions  Continue on Zyrtec 10mg  At bedtime As needed  Drainage   Continue on BREO 1 puff  daily , rinse after use.  Use Albuterol inhaler /Neb As needed  Every  6 hr for wheezing .  Follow up with Dr. Vassie Loll  In 6 months and As needed   Please contact office for sooner follow up if symptoms do not improve or worsen or seek emergency care         Rubye Oaks, NP 11/02/2020

## 2020-11-02 NOTE — Assessment & Plan Note (Signed)
Patient is continue on Breo daily.  Patient is encouraged on medication compliance. Continue on trigger prevention.  Asthma action plan discussed    Plan  Patient Instructions  Continue on Zyrtec 10mg  At bedtime As needed  Drainage   Continue on BREO 1 puff daily , rinse after use.  Use Albuterol inhaler /Neb As needed  Every  6 hr for wheezing .  Follow up with Dr.  In 6 months and As needed   Please contact office for sooner follow up if symptoms do not improve or worsen or seek emergency care

## 2020-11-02 NOTE — Telephone Encounter (Signed)
Patient dropped off FMLA paper to be filled out and signed by Dr. Vassie Loll. Emailed to [employer] for review.

## 2020-11-10 DIAGNOSIS — Z0289 Encounter for other administrative examinations: Secondary | ICD-10-CM

## 2020-11-10 NOTE — Telephone Encounter (Signed)
Paperwork completed and signed. Patient picked up paperwork and paid form fee.

## 2020-11-23 DIAGNOSIS — R946 Abnormal results of thyroid function studies: Secondary | ICD-10-CM | POA: Diagnosis not present

## 2020-11-23 DIAGNOSIS — E042 Nontoxic multinodular goiter: Secondary | ICD-10-CM | POA: Diagnosis not present

## 2020-11-23 DIAGNOSIS — Z808 Family history of malignant neoplasm of other organs or systems: Secondary | ICD-10-CM | POA: Diagnosis not present

## 2021-01-11 DIAGNOSIS — Z6834 Body mass index (BMI) 34.0-34.9, adult: Secondary | ICD-10-CM | POA: Diagnosis not present

## 2021-01-11 DIAGNOSIS — Z113 Encounter for screening for infections with a predominantly sexual mode of transmission: Secondary | ICD-10-CM | POA: Diagnosis not present

## 2021-01-11 DIAGNOSIS — Z1231 Encounter for screening mammogram for malignant neoplasm of breast: Secondary | ICD-10-CM | POA: Diagnosis not present

## 2021-01-11 DIAGNOSIS — Z01411 Encounter for gynecological examination (general) (routine) with abnormal findings: Secondary | ICD-10-CM | POA: Diagnosis not present

## 2021-01-11 DIAGNOSIS — N760 Acute vaginitis: Secondary | ICD-10-CM | POA: Diagnosis not present

## 2021-01-11 DIAGNOSIS — N898 Other specified noninflammatory disorders of vagina: Secondary | ICD-10-CM | POA: Diagnosis not present

## 2021-01-24 ENCOUNTER — Other Ambulatory Visit: Payer: Self-pay | Admitting: Obstetrics & Gynecology

## 2021-01-24 DIAGNOSIS — N6321 Unspecified lump in the left breast, upper outer quadrant: Secondary | ICD-10-CM

## 2021-01-24 DIAGNOSIS — R928 Other abnormal and inconclusive findings on diagnostic imaging of breast: Secondary | ICD-10-CM

## 2021-02-12 DIAGNOSIS — N898 Other specified noninflammatory disorders of vagina: Secondary | ICD-10-CM | POA: Diagnosis not present

## 2021-02-12 DIAGNOSIS — N760 Acute vaginitis: Secondary | ICD-10-CM | POA: Diagnosis not present

## 2021-02-12 DIAGNOSIS — Z113 Encounter for screening for infections with a predominantly sexual mode of transmission: Secondary | ICD-10-CM | POA: Diagnosis not present

## 2021-03-02 ENCOUNTER — Other Ambulatory Visit: Payer: No Typology Code available for payment source

## 2021-06-25 ENCOUNTER — Telehealth: Payer: Self-pay | Admitting: Adult Health

## 2021-06-25 NOTE — Telephone Encounter (Signed)
Patient called to find out how to get dates changed on FMLA paperwork.  I told her to have the insurance company fax request to [place].  I also told her an in office appointment may be required because she has not been seen since Sept 2022.

## 2021-07-05 DIAGNOSIS — N898 Other specified noninflammatory disorders of vagina: Secondary | ICD-10-CM | POA: Diagnosis not present

## 2021-07-05 DIAGNOSIS — Z113 Encounter for screening for infections with a predominantly sexual mode of transmission: Secondary | ICD-10-CM | POA: Diagnosis not present

## 2021-07-05 DIAGNOSIS — L293 Anogenital pruritus, unspecified: Secondary | ICD-10-CM | POA: Diagnosis not present

## 2021-07-05 DIAGNOSIS — B3731 Acute candidiasis of vulva and vagina: Secondary | ICD-10-CM | POA: Diagnosis not present

## 2021-07-23 NOTE — Telephone Encounter (Signed)
Patient called to find out status of FMLA paperwork - I let her know we have not received anything from her insurance.  She stated they said it was faxed on 5/31 - we don't have it.  I gave her 2 fax #'s to give to insurance - [phone] and [phone].  I also told her insurance can call me directly.

## 2021-07-24 NOTE — Telephone Encounter (Signed)
I called [employer] on 6/19 and left message for case manager to call me back - we still have not received a copy of the FMLA paperwork.  Patient left me a voice message stating she has a copy.  I called her this morning and asked her to bring it by the [employer] office and mark it to my attention.

## 2021-07-26 ENCOUNTER — Telehealth: Payer: Self-pay | Admitting: Adult Health

## 2021-08-01 NOTE — Telephone Encounter (Signed)
Called patient about FMLA paperwork - she stated nothing has changed from the form completed in Oct 2022.  Start date will need to be 05/13/2021.  I completed the form using the information from the Oct 2022 form and sent it to T. Parrett for signature.  She will be back in the office on 7/3.

## 2021-08-03 NOTE — Telephone Encounter (Signed)
Noted  

## 2021-08-08 NOTE — Telephone Encounter (Signed)
Rubye Oaks, NP signed the FMLA paperwork and I faxed it to [facility] fax# [phone].  Mailed a hard copy to the patient.

## 2021-09-18 DIAGNOSIS — N760 Acute vaginitis: Secondary | ICD-10-CM | POA: Diagnosis not present

## 2021-09-18 DIAGNOSIS — N898 Other specified noninflammatory disorders of vagina: Secondary | ICD-10-CM | POA: Diagnosis not present

## 2022-01-17 DIAGNOSIS — N760 Acute vaginitis: Secondary | ICD-10-CM | POA: Diagnosis not present

## 2022-01-17 DIAGNOSIS — Z113 Encounter for screening for infections with a predominantly sexual mode of transmission: Secondary | ICD-10-CM | POA: Diagnosis not present

## 2022-01-17 DIAGNOSIS — R5383 Other fatigue: Secondary | ICD-10-CM | POA: Diagnosis not present

## 2022-01-17 DIAGNOSIS — N898 Other specified noninflammatory disorders of vagina: Secondary | ICD-10-CM | POA: Diagnosis not present

## 2022-01-17 DIAGNOSIS — Z01411 Encounter for gynecological examination (general) (routine) with abnormal findings: Secondary | ICD-10-CM | POA: Diagnosis not present

## 2022-02-14 DIAGNOSIS — R928 Other abnormal and inconclusive findings on diagnostic imaging of breast: Secondary | ICD-10-CM | POA: Diagnosis not present

## 2022-03-19 ENCOUNTER — Telehealth: Payer: Self-pay

## 2022-03-21 NOTE — Telephone Encounter (Signed)
Patient came by office to ask about extending FMLA - I told her to have insurance fax a new FMLA form to [place] for completion.  In addition, since she has not been seen in office since Sept 2022, an appointment was made with Rexene Edison, NP for Feb 22 for routine follow-up.

## 2022-03-28 ENCOUNTER — Encounter: Payer: Self-pay | Admitting: Adult Health

## 2022-03-28 ENCOUNTER — Telehealth: Payer: Self-pay | Admitting: *Deleted

## 2022-03-28 ENCOUNTER — Ambulatory Visit (INDEPENDENT_AMBULATORY_CARE_PROVIDER_SITE_OTHER): Payer: BC Managed Care – PPO | Admitting: Adult Health

## 2022-03-28 VITALS — BP 100/70 | HR 101 | Temp 98.4°F | Ht 65.38 in | Wt 220.6 lb

## 2022-03-28 DIAGNOSIS — J45998 Other asthma: Secondary | ICD-10-CM

## 2022-03-28 DIAGNOSIS — J4530 Mild persistent asthma, uncomplicated: Secondary | ICD-10-CM | POA: Diagnosis not present

## 2022-03-28 MED ORDER — FLUTICASONE FUROATE-VILANTEROL 100-25 MCG/ACT IN AEPB: 1.0000 | INHALATION_SPRAY | Freq: Every day | RESPIRATORY_TRACT | 11 refills | Status: DC

## 2022-03-28 MED ORDER — ALBUTEROL SULFATE 0.63 MG/3ML IN NEBU: 1.0000 | INHALATION_SOLUTION | Freq: Four times a day (QID) | RESPIRATORY_TRACT | 2 refills | Status: DC | PRN

## 2022-03-28 MED ORDER — ALBUTEROL SULFATE HFA 108 (90 BASE) MCG/ACT IN AERS: 2.0000 | INHALATION_SPRAY | RESPIRATORY_TRACT | 3 refills | Status: DC | PRN

## 2022-03-28 NOTE — Telephone Encounter (Signed)
FMLA paperwork signed by Rexene Edison NP.  Patient was seen in the office today and she was provided with a copy of the paperwork.  Original paperwork given to [place] and she will fax to number on the form.  Nothing further needed.

## 2022-03-28 NOTE — Assessment & Plan Note (Signed)
Mild persistent asthma.-Patient does have increased symptom burden and decreased activity tolerance.  Would restart on Breo daily.  Will continue on trigger prevention and Zyrtec daily.  Plan Patient Instructions  Continue on Zyrtec 46m At bedtime As needed  Drainage   Restart BREO 1 puff daily , rinse after use.  Use Albuterol inhaler /Neb As needed  Every  6 hr for wheezing .  Follow up with Dr. AElsworth Soho in 1 year and As needed   Please contact office for sooner follow up if symptoms do not improve or worsen or seek emergency care

## 2022-03-28 NOTE — Progress Notes (Signed)
$@PatientQ$  ID: Madeline Gomez, female    DOB: May 24, 1980, 42 y.o.   MRN: [id number]  Chief Complaint  Patient presents with   Follow-up    Referring provider: No ref. provider found  HPI: 42 yo female never smoker followed for Mild persistent asthma  Covid 19 infection 2022   TEST/EVENTS :  Spirometry  11/2012 - FEv1 77%, FVC 101%, ratio 65 s/o mild obstruction     08/30/2013 Spirometry >>moderate airway obstruction with FEV1 65%, FVC 86% and ratio 65   Spirometry 04/2017 mild airway obstruction with ratio 71, FEV1 of 80% and FVC of 94%  03/28/2022 Follow up : Asthma and Allergic Rhinitis  Patient returns for a follow-up visit.  Last seen September 2022.  Patient says overall her asthma has been doing well.  She denies any flare of cough or wheezing.  She says that she does take Breo but has not been taking it on a regular basis.  She says she only does it whenever she feels like she needs it or there is change in the weather.  She says that she is noticing with the change in weather now that her asthma is not been doing as well.  She also is not able to do a lot of exercise because she feels like her asthma will flareup especially if she is outside. She declined flu shot.  Patient education was given.  Patient has recently started back on to Zyrtec daily.   Allergies  Allergen Reactions   Other Other (See Comments)    PT IS ALLERGIC TO GRASS, CATS, TOMATOES, PINEAPPLES, POLLEN   Percocet [Oxycodone-Acetaminophen] Itching   Prednisone Itching    Rapid heart rate, made me feel bad    There is no immunization history for the selected administration types on file for this patient.  Past Medical History:  Diagnosis Date   Anemia    Asthma    Broken bones    RIGHT FOOT    BV (bacterial vaginosis)    RECURRENT   H/O vitamin D deficiency    Thyroid disease     Tobacco History: Social History   Tobacco Use  Smoking Status Never  Smokeless Tobacco Never   Counseling  given: Not Answered   Outpatient Medications Prior to Visit  Medication Sig Dispense Refill   cetirizine (ZYRTEC) 10 MG tablet Take 10 mg by mouth daily.     fluticasone furoate-vilanterol (BREO ELLIPTA) 100-25 MCG/INH AEPB Inhale 1 puff into the lungs daily. 60 each 5   OVER THE COUNTER MEDICATION daily. OTC immune support     albuterol (ACCUNEB) 0.63 MG/3ML nebulizer solution Take 3 mLs (0.63 mg total) by nebulization every 6 (six) hours as needed for wheezing. 120 mL 1   albuterol (VENTOLIN HFA) 108 (90 Base) MCG/ACT inhaler Inhale 2 puffs into the lungs every 4 (four) hours as needed for wheezing. 1 each 3   Facility-Administered Medications Prior to Visit  Medication Dose Route Frequency Provider Last Rate Last Admin   fluticasone furoate-vilanterol (BREO ELLIPTA) 100-25 MCG/INH 1 puff  1 puff Inhalation Daily Makenzie Vittorio S, NP         Review of Systems:   Constitutional:   No  weight loss, night sweats,  Fevers, chills, fatigue, or  lassitude.  HEENT:   No headaches,  Difficulty swallowing,  Tooth/dental problems, or  Sore throat,                No sneezing, itching, ear ache + nasal congestion,  post nasal drip,   CV:  No chest pain,  Orthopnea, PND, swelling in lower extremities, anasarca, dizziness, palpitations, syncope.   GI  No heartburn, indigestion, abdominal pain, nausea, vomiting, diarrhea, change in bowel habits, loss of appetite, bloody stools.   Resp: No chest wall deformity  Skin: no rash or lesions.  GU: no dysuria, change in color of urine, no urgency or frequency.  No flank pain, no hematuria   MS:  No joint pain or swelling.  No decreased range of motion.  No back pain.    Physical Exam  BP 100/70 (BP Location: Left Arm, Patient Position: Sitting, Cuff Size: Large)   Pulse (!) 101   Temp 98.4 F (36.9 C) (Oral)   Ht 5' 5.38" (1.661 m)   Wt 220 lb 9.6 oz (100.1 kg)   SpO2 100%   BMI 36.29 kg/m   GEN: A/Ox3; pleasant , NAD, well nourished     HEENT:  [place]/AT,  NOSE-clear, THROAT-clear, no lesions, no postnasal drip or exudate noted.   NECK:  Supple w/ fair ROM; no JVD; normal carotid impulses w/o bruits; no thyromegaly or nodules palpated; no lymphadenopathy.    RESP  Clear  P & A; w/o, wheezes/ rales/ or rhonchi. no accessory muscle use, no dullness to percussion  CARD:  RRR, no m/r/g, no peripheral edema, pulses intact, no cyanosis or clubbing.  GI:   Soft & nt; nml bowel sounds; no organomegaly or masses detected.   Musco: Warm bil, no deformities or joint swelling noted.   Neuro: alert, no focal deficits noted.    Skin: Warm, no lesions or rashes    Lab Results:    BNP No results found for: "BNP"  ProBNP No results found for: "PROBNP"  Imaging: No results found.       Latest Ref Rng & Units 06/01/2020   11:08 AM  PFT Results  FVC-Pre L 2.46   FVC-Predicted Pre % 81   FVC-Post L 2.55   FVC-Predicted Post % 84   Pre FEV1/FVC % % 75   Post FEV1/FCV % % 80   FEV1-Pre L 1.85   FEV1-Predicted Pre % 74   FEV1-Post L 2.05   DLCO uncorrected ml/min/mmHg 21.23   DLCO UNC% % 98   DLCO corrected ml/min/mmHg 21.23   DLCO COR %Predicted % 98   DLVA Predicted % 130   TLC L 3.62   TLC % Predicted % 72   RV % Predicted % 69     No results found for: "NITRICOXIDE"      Assessment & Plan:   Asthma, chronic Mild persistent asthma.-Patient does have increased symptom burden and decreased activity tolerance.  Would restart on Breo daily.  Will continue on trigger prevention and Zyrtec daily.  Plan Patient Instructions  Continue on Zyrtec 2m At bedtime As needed  Drainage   Restart BREO 1 puff daily , rinse after use.  Use Albuterol inhaler /Neb As needed  Every  6 hr for wheezing .  Follow up with Dr. AElsworth Soho in 1 year and As needed   Please contact office for sooner follow up if symptoms do not improve or worsen or seek emergency care       TRexene Edison NP 03/28/2022

## 2022-03-28 NOTE — Patient Instructions (Addendum)
Continue on Zyrtec 64m At bedtime As needed  Drainage   Restart BREO 1 puff daily , rinse after use.  Use Albuterol inhaler /Neb As needed  Every  6 hr for wheezing .  Follow up with Dr. AElsworth Soho in 1 year and As needed   Please contact office for sooner follow up if symptoms do not improve or worsen or seek emergency care

## 2022-03-29 ENCOUNTER — Other Ambulatory Visit: Payer: Self-pay | Admitting: Adult Health

## 2022-03-29 DIAGNOSIS — Z0289 Encounter for other administrative examinations: Secondary | ICD-10-CM

## 2022-03-29 NOTE — Telephone Encounter (Signed)
FMLA form signed by Rexene Edison, NP on 2/22 and copy provided to patient during her appointment.  Copy also faxed to [facility] - fax# [phone].  Processing fee applied to account.

## 2022-04-02 ENCOUNTER — Telehealth: Payer: Self-pay | Admitting: Adult Health

## 2022-04-02 NOTE — Telephone Encounter (Signed)
PT calling because her Pharm says her ins does not cover her Memory [employer] any longer. Pls call PT to advise of an alternative.  Pharm is [facility] on [address].   PT does not like Symbicort or Advair, she said. Can we do a PA possibly.

## 2022-04-03 ENCOUNTER — Other Ambulatory Visit (HOSPITAL_COMMUNITY): Payer: Self-pay

## 2022-04-03 NOTE — Telephone Encounter (Signed)
Tammy please advise  Please see previous encounters

## 2022-04-03 NOTE — Telephone Encounter (Signed)
Per benefits investigation the following alternatives to the [facility] are covered:   Brand Advair Diskus, Ruthe Mannan, Advair HFA, Brand Symbicort.

## 2022-04-03 NOTE — Telephone Encounter (Signed)
LVM for patient to call the office back. 

## 2022-04-03 NOTE — Telephone Encounter (Signed)
Recommend change to Dulera 100 2 puffs Twice daily  , rinse after use. (Please let pt know)

## 2022-04-03 NOTE — Telephone Encounter (Signed)
Can we run a ticket on patients insurance to see what is preferred

## 2022-04-05 MED ORDER — MOMETASONE FURO-FORMOTEROL FUM 100-5 MCG/ACT IN AERO: 2.0000 | INHALATION_SPRAY | Freq: Two times a day (BID) | RESPIRATORY_TRACT | 5 refills | Status: DC

## 2022-04-05 NOTE — Telephone Encounter (Signed)
Notified pt of switch to [facility]. Pt stated understanding and instructions. Dulera called into preferred pharmacy. Nothing further needed at this time.

## 2022-12-05 DIAGNOSIS — Z808 Family history of malignant neoplasm of other organs or systems: Secondary | ICD-10-CM | POA: Diagnosis not present

## 2022-12-05 DIAGNOSIS — R946 Abnormal results of thyroid function studies: Secondary | ICD-10-CM | POA: Diagnosis not present

## 2022-12-05 DIAGNOSIS — R5383 Other fatigue: Secondary | ICD-10-CM | POA: Diagnosis not present

## 2022-12-05 DIAGNOSIS — E042 Nontoxic multinodular goiter: Secondary | ICD-10-CM | POA: Diagnosis not present

## 2023-02-27 DIAGNOSIS — Z01411 Encounter for gynecological examination (general) (routine) with abnormal findings: Secondary | ICD-10-CM | POA: Diagnosis not present

## 2023-02-27 DIAGNOSIS — N898 Other specified noninflammatory disorders of vagina: Secondary | ICD-10-CM | POA: Diagnosis not present

## 2023-02-27 DIAGNOSIS — Z1231 Encounter for screening mammogram for malignant neoplasm of breast: Secondary | ICD-10-CM | POA: Diagnosis not present

## 2023-02-27 DIAGNOSIS — N760 Acute vaginitis: Secondary | ICD-10-CM | POA: Diagnosis not present

## 2023-02-27 DIAGNOSIS — Z113 Encounter for screening for infections with a predominantly sexual mode of transmission: Secondary | ICD-10-CM | POA: Diagnosis not present

## 2023-04-14 ENCOUNTER — Other Ambulatory Visit: Payer: Self-pay

## 2023-04-14 ENCOUNTER — Emergency Department (HOSPITAL_COMMUNITY)

## 2023-04-14 ENCOUNTER — Encounter (HOSPITAL_COMMUNITY): Payer: Self-pay | Admitting: *Deleted

## 2023-04-14 ENCOUNTER — Inpatient Hospital Stay (HOSPITAL_COMMUNITY)
Admission: EM | Admit: 2023-04-14 | Discharge: 2023-04-23 | DRG: 066 | Disposition: A | Discharge status: Home / Self Care | Attending: Neurosurgery | Admitting: Neurosurgery

## 2023-04-14 DIAGNOSIS — G919 Hydrocephalus, unspecified: Secondary | ICD-10-CM | POA: Diagnosis not present

## 2023-04-14 DIAGNOSIS — E042 Nontoxic multinodular goiter: Secondary | ICD-10-CM | POA: Diagnosis not present

## 2023-04-14 DIAGNOSIS — G43909 Migraine, unspecified, not intractable, without status migrainosus: Secondary | ICD-10-CM | POA: Diagnosis present

## 2023-04-14 DIAGNOSIS — E876 Hypokalemia: Secondary | ICD-10-CM | POA: Diagnosis present

## 2023-04-14 DIAGNOSIS — U071 COVID-19: Secondary | ICD-10-CM

## 2023-04-14 DIAGNOSIS — J45998 Other asthma: Secondary | ICD-10-CM

## 2023-04-14 DIAGNOSIS — Z7951 Long term (current) use of inhaled steroids: Secondary | ICD-10-CM

## 2023-04-14 DIAGNOSIS — Z79899 Other long term (current) drug therapy: Secondary | ICD-10-CM

## 2023-04-14 DIAGNOSIS — J4530 Mild persistent asthma, uncomplicated: Secondary | ICD-10-CM | POA: Diagnosis not present

## 2023-04-14 DIAGNOSIS — R531 Weakness: Secondary | ICD-10-CM

## 2023-04-14 DIAGNOSIS — G9389 Other specified disorders of brain: Secondary | ICD-10-CM | POA: Diagnosis not present

## 2023-04-14 DIAGNOSIS — Z91148 Patient's other noncompliance with medication regimen for other reason: Secondary | ICD-10-CM | POA: Diagnosis not present

## 2023-04-14 DIAGNOSIS — I609 Nontraumatic subarachnoid hemorrhage, unspecified: Secondary | ICD-10-CM | POA: Diagnosis not present

## 2023-04-14 DIAGNOSIS — Z8249 Family history of ischemic heart disease and other diseases of the circulatory system: Secondary | ICD-10-CM | POA: Diagnosis not present

## 2023-04-14 DIAGNOSIS — Z885 Allergy status to narcotic agent status: Secondary | ICD-10-CM | POA: Diagnosis not present

## 2023-04-14 DIAGNOSIS — Z888 Allergy status to other drugs, medicaments and biological substances status: Secondary | ICD-10-CM

## 2023-04-14 DIAGNOSIS — R519 Headache, unspecified: Secondary | ICD-10-CM | POA: Diagnosis not present

## 2023-04-14 DIAGNOSIS — S066X0A Traumatic subarachnoid hemorrhage without loss of consciousness, initial encounter: Principal | ICD-10-CM

## 2023-04-14 DIAGNOSIS — R110 Nausea: Secondary | ICD-10-CM | POA: Diagnosis not present

## 2023-04-14 DIAGNOSIS — R29700 NIHSS score 0: Secondary | ICD-10-CM | POA: Diagnosis not present

## 2023-04-14 LAB — CBC
HCT: 43 % (ref 36.0–46.0)
HCT: 41.6 % (ref 36.0–46.0)
Hemoglobin: 13.8 g/dL (ref 12.0–15.0)
Hemoglobin: 13.3 g/dL (ref 12.0–15.0)
MCH: 27.7 pg (ref 26.0–34.0)
MCH: 27.3 pg (ref 26.0–34.0)
MCHC: 32.1 g/dL (ref 30.0–36.0)
MCHC: 32 g/dL (ref 30.0–36.0)
MCV: 86.2 fL (ref 80.0–100.0)
MCV: 85.4 fL (ref 80.0–100.0)
Platelets: 242 10*3/uL (ref 150–400)
Platelets: 297 10*3/uL (ref 150–400)
RBC: 4.99 MIL/uL (ref 3.87–5.11)
RBC: 4.87 MIL/uL (ref 3.87–5.11)
RDW: 13.3 % (ref 11.5–15.5)
RDW: 13.4 % (ref 11.5–15.5)
WBC: 7.3 10*3/uL (ref 4.0–10.5)
WBC: 10.2 10*3/uL (ref 4.0–10.5)
nRBC: 0 % (ref 0.0–0.2)
nRBC: 0 % (ref 0.0–0.2)

## 2023-04-14 LAB — BASIC METABOLIC PANEL
Anion gap: 11 (ref 5–15)
BUN: 6 mg/dL (ref 6–20)
CO2: 21 mmol/L — ABNORMAL LOW (ref 22–32)
Calcium: 8.6 mg/dL — ABNORMAL LOW (ref 8.9–10.3)
Chloride: 107 mmol/L (ref 98–111)
Creatinine, Ser: 0.82 mg/dL (ref 0.44–1.00)
GFR, Estimated: >60 mL/min (ref >60)
Glucose, Bld: 92 mg/dL (ref 70–99)
Potassium: 4 mmol/L (ref 3.5–5.1)
Sodium: 139 mmol/L (ref 135–145)

## 2023-04-14 LAB — HCG, SERUM, QUALITATIVE: Preg, Serum: NEGATIVE

## 2023-04-14 MED ORDER — HYDROMORPHONE HCL 1 MG/ML IJ SOLN
0.5000 mg | INTRAMUSCULAR | Status: DC | PRN
  Administered 2023-04-16 – 2023-04-18 (×9): 0.5 mg via INTRAVENOUS
  Filled 2023-04-14 (×9): qty 1

## 2023-04-14 MED ORDER — ACETAMINOPHEN 160 MG/5ML PO SOLN: 650.0000 mg | ORAL | Status: DC | PRN

## 2023-04-14 MED ORDER — SENNOSIDES-DOCUSATE SODIUM 8.6-50 MG PO TABS
1.0000 | ORAL_TABLET | Freq: Two times a day (BID) | ORAL | Status: DC
  Administered 2023-04-14 – 2023-04-23 (×17): 1 via ORAL
  Filled 2023-04-14 (×17): qty 1

## 2023-04-14 MED ORDER — NIMODIPINE 30 MG PO CAPS
60.0000 mg | ORAL_CAPSULE | ORAL | Status: DC
  Administered 2023-04-14 – 2023-04-23 (×52): 60 mg via ORAL
  Filled 2023-04-14 (×51): qty 2

## 2023-04-14 MED ORDER — ACETAMINOPHEN 650 MG RE SUPP: 650.0000 mg | RECTAL | Status: DC | PRN

## 2023-04-14 MED ORDER — DIPHENHYDRAMINE HCL 50 MG/ML IJ SOLN
25.0000 mg | Freq: Once | INTRAMUSCULAR | Status: AC
  Administered 2023-04-14: 25 mg via INTRAVENOUS
  Filled 2023-04-14: qty 1

## 2023-04-14 MED ORDER — SODIUM CHLORIDE 0.9 % IV BOLUS: 1000.0000 mL | Freq: Once | INTRAVENOUS | Status: DC

## 2023-04-14 MED ORDER — MOMETASONE FURO-FORMOTEROL FUM 100-5 MCG/ACT IN AERO: 2.0000 | INHALATION_SPRAY | Freq: Two times a day (BID) | RESPIRATORY_TRACT | Status: DC

## 2023-04-14 MED ORDER — LABETALOL HCL 5 MG/ML IV SOLN: 20.0000 mg | Freq: Once | INTRAVENOUS | Status: DC

## 2023-04-14 MED ORDER — CHLORHEXIDINE GLUCONATE CLOTH 2 % EX PADS
6.0000 | MEDICATED_PAD | Freq: Every day | CUTANEOUS | Status: DC
  Administered 2023-04-15 – 2023-04-22 (×7): 6 via TOPICAL

## 2023-04-14 MED ORDER — STROKE: EARLY STAGES OF RECOVERY BOOK
Freq: Once | Status: AC
  Filled 2023-04-14: qty 1

## 2023-04-14 MED ORDER — FLUTICASONE FUROATE-VILANTEROL 100-25 MCG/ACT IN AEPB
1.0000 | INHALATION_SPRAY | Freq: Every day | RESPIRATORY_TRACT | Status: DC
  Administered 2023-04-15 – 2023-04-22 (×2): 1 via RESPIRATORY_TRACT
  Filled 2023-04-14 (×3): qty 28

## 2023-04-14 MED ORDER — NIMODIPINE 6 MG/ML PO SOLN
60.0000 mg | ORAL | Status: DC
  Filled 2023-04-14: qty 10

## 2023-04-14 MED ORDER — NICARDIPINE HCL IN NACL 20-0.86 MG/200ML-% IV SOLN: 0.0000 mg/h | INTRAVENOUS | Status: DC

## 2023-04-14 MED ORDER — ACETAMINOPHEN 325 MG PO TABS
650.0000 mg | ORAL_TABLET | ORAL | Status: DC | PRN
  Administered 2023-04-14 – 2023-04-23 (×31): 650 mg via ORAL
  Filled 2023-04-14 (×31): qty 2

## 2023-04-14 MED ORDER — LEVETIRACETAM IN NACL 500 MG/100ML IV SOLN
500.0000 mg | Freq: Two times a day (BID) | INTRAVENOUS | Status: DC
  Administered 2023-04-14 – 2023-04-15 (×3): 500 mg via INTRAVENOUS
  Filled 2023-04-14 (×3): qty 100

## 2023-04-14 MED ORDER — PANTOPRAZOLE SODIUM 40 MG IV SOLR
40.0000 mg | Freq: Every day | INTRAVENOUS | Status: DC
  Administered 2023-04-14 – 2023-04-15 (×2): 40 mg via INTRAVENOUS
  Filled 2023-04-14 (×2): qty 10

## 2023-04-14 MED ORDER — FLUTICASONE FUROATE-VILANTEROL 100-25 MCG/ACT IN AEPB
1.0000 | INHALATION_SPRAY | Freq: Every day | RESPIRATORY_TRACT | Status: DC
  Filled 2023-04-14: qty 28

## 2023-04-14 MED ORDER — PROCHLORPERAZINE EDISYLATE 10 MG/2ML IJ SOLN
10.0000 mg | INTRAMUSCULAR | Status: AC
  Administered 2023-04-14: 10 mg via INTRAVENOUS
  Filled 2023-04-14: qty 2

## 2023-04-14 MED ORDER — ALBUTEROL SULFATE (2.5 MG/3ML) 0.083% IN NEBU: 3.0000 mL | INHALATION_SOLUTION | RESPIRATORY_TRACT | Status: DC | PRN

## 2023-04-14 MED ORDER — METOCLOPRAMIDE HCL 5 MG/ML IJ SOLN
10.0000 mg | Freq: Once | INTRAMUSCULAR | Status: AC
  Administered 2023-04-14: 10 mg via INTRAVENOUS
  Filled 2023-04-14: qty 2

## 2023-04-14 MED ORDER — IOHEXOL 350 MG/ML SOLN
75.0000 mL | Freq: Once | INTRAVENOUS | Status: AC | PRN
  Administered 2023-04-14: 75 mL via INTRAVENOUS

## 2023-04-14 NOTE — ED Provider Triage Note (Signed)
 Emergency Medicine Provider Triage Evaluation Note  Madeline Gomez , a 43 y.o. female  was evaluated in triage.  Pt complains of sudden severe headache.  Onset approximately 10 AM.  Patient reports that the headache is at the top of her head and very painful at the back of her neck.  Headache started after an orgasm this morning.  Patient reports that she had a distant history of migraines but has not had one in years.  She denies any formal migraine therapy but just occasional headaches that were bad.  She is otherwise well leading up to this episode.  Only other medical condition is asthma.  No visual disturbance or focal neurologic deficit.  Review of Systems  Positive: Headache Negative: Focal neurologic deficit or fever  Physical Exam  BP (!) 147/98 (BP Location: Right Arm)   Pulse 65   Temp 98 F (36.7 C) (Oral)   Resp 18   SpO2 100%  Gen:   Uncomfortable in appearance.  Mental status is clear.  GCS 15.  No respiratory distress. Resp:  Normal effort clear to auscultation MSK:   Moves extremities without difficulty grip strength intact. Other:  Extraocular motions normal.  No focal neurologic deficits.  Medical Decision Making  Medically screening exam initiated at 1:20 PM.  Appropriate orders placed.  JAHLEAH MARISCAL was informed that the remainder of the evaluation will be completed by another provider, this initial triage assessment does not replace that evaluation, and the importance of remaining in the ED until their evaluation is complete.     Arby Barrette, MD 04/14/23 1322

## 2023-04-14 NOTE — Consult Note (Signed)
 Reason for Consult: Subarachnoid hemorrhage Referring Physician: Emergency department  Madeline Gomez is an 43 y.o. female.  HPI: 43 year old female who had sudden onset worst headache of her life associated with dizziness this morning around 10:00 called EMS husband was at work.  Patient brought to the emergency department was noted to be awake and alert CT scan showed subarachnoid hemorrhage and patient was sent for CT angiogram.  Patient's past history is remarkable for asthma and some thyroid disease  Past Medical History:  Diagnosis Date   Anemia    Asthma    Broken bones    RIGHT FOOT    BV (bacterial vaginosis)    RECURRENT   H/O vitamin D deficiency    Thyroid disease     Past Surgical History:  Procedure Laterality Date   CESAREAN SECTION     WISDOM TOOTH EXTRACTION      Family History  Problem Relation Age of Onset   Glaucoma Paternal Grandfather    Glaucoma Paternal Grandmother    Heart disease Maternal Grandmother    Asthma Maternal Grandmother    Anemia Maternal Grandmother    Cancer Mother        THYROID AND CERVICAL   Hypertension Mother    Hyperlipidemia Father     Social History:  reports that she has never smoked. She has never used smokeless tobacco. She reports that she does not drink alcohol and does not use drugs.  Allergies:  Allergies  Allergen Reactions   Other Other (See Comments)    PT IS ALLERGIC TO GRASS, CATS, TOMATOES, PINEAPPLES, POLLEN   Percocet [Oxycodone-Acetaminophen] Itching   Prednisone Itching    Rapid heart rate, made me feel bad    Medications: I have reviewed the patient's current medications.  Results for orders placed or performed during the hospital encounter of 04/14/23 (from the past 48 hours)  CBC     Status: None   Collection Time: 04/14/23  1:41 PM  Result Value Ref Range   WBC 7.3 4.0 - 10.5 K/uL   RBC 4.99 3.87 - 5.11 MIL/uL   Hemoglobin 13.8 12.0 - 15.0 g/dL   HCT 16.1 09.6 - 04.5 %   MCV 86.2 80.0 -  100.0 fL   MCH 27.7 26.0 - 34.0 pg   MCHC 32.1 30.0 - 36.0 g/dL   RDW 40.9 81.1 - 91.4 %   Platelets 242 150 - 400 K/uL   nRBC 0.0 0.0 - 0.2 %    Comment: Performed at [facility], [address], [place], [place] [address]  hCG, serum, qualitative     Status: None   Collection Time: 04/14/23  1:41 PM  Result Value Ref Range   Preg, Serum NEGATIVE NEGATIVE    Comment:        THE SENSITIVITY OF THIS METHODOLOGY IS >10 mIU/mL. Performed at [facility], [address], [place], [place] [address]     CT Head Wo Contrast Result Date: 04/14/2023 CLINICAL DATA:  Provided history: Headache, sudden, severe. Sudden onset frontal/facial headache. Nausea. History of migraines and asthma. EXAM: CT HEAD WITHOUT CONTRAST TECHNIQUE: Contiguous axial images were obtained from the base of the skull through the vertex without intravenous contrast. RADIATION DOSE REDUCTION: This exam was performed according to the departmental dose-optimization program which includes automated exposure control, adjustment of the mA and/or kV according to patient size and/or use of iterative reconstruction technique. COMPARISON:  None. FINDINGS: Brain: No age-advanced or lobar predominant cerebral atrophy. Moderate-volume  acute subarachnoid hemorrhage, predominantly within the basal cisterns. Mild ventricular prominence suggesting early hydrocephalus. No demarcated cortical infarct. No extra-axial fluid collection. No evidence of an intracranial mass. No midline shift. Vascular: No hyperdense vessel. Skull: No calvarial fracture or aggressive osseous lesion. Sinuses/Orbits: No mass or acute finding within the imaged orbits. Minimal mucosal thickening within the bilateral maxillary sinuses at the imaged levels. These results were called by telephone at the time of interpretation on 04/14/2023 at 4:15 pm to provider Dr. Rosalia Hammers, who verbally acknowledged these results. IMPRESSION: 1. 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 CTA recommended for further evaluation. 2. Mild ventricular prominence consistent with early hydrocephalus. Prior of eye Electronically Signed   By: Jackey Loge D.O.   On: 04/14/2023 16:24    Review of Systems  Neurological:  Positive for headaches.   Blood pressure (!) 140/85, pulse 85, temperature 98 F (36.7 C), temperature source Oral, resp. rate 13, SpO2 97%. Physical Exam HENT:     Head: Normocephalic.  Pulmonary:     Effort: Pulmonary effort is normal.  Abdominal:     Palpations: Abdomen is soft.  Musculoskeletal:        General: Normal range of motion.     Cervical back: Normal range of motion.  Neurological:     Mental Status: She is alert.     Comments: Patient was somnolent but easily aroused oriented x 3 pupils are equal cranial nerves are intact strength is 5 out of 5 upper and lower extremities     Assessment/Plan: 43 year old grade 1 subarachnoid hemorrhage CTA results are pending discussed with vascular neurosurgery considering angiography in the morning will admit to the ICU subarachnoid hemorrhage precautions observation blood pressure control and Nimotop  Mariam Dollar 04/14/2023, 5:21 PM

## 2023-04-14 NOTE — ED Notes (Signed)
 Pt BMP hemolyzed. RN recollected and sent to lab.

## 2023-04-14 NOTE — ED Notes (Signed)
RN attempted to call report x1 

## 2023-04-14 NOTE — ED Triage Notes (Signed)
 Pt arrived via ems with a sudden onset of a frontal/facial headache. Pt reported nausea and received Zofran 4mg  IV en route to ED. PT has a history of migraines and asthma. No facial deficits. States nausea is increasing and now has pain to the back of right head area. Pupils 3 RRB. Pt MAE. Pt only thing that she can remember that happened differently this am is that she had an orgasm this am prior to her head starting to hurt.

## 2023-04-14 NOTE — ED Provider Notes (Signed)
 [place] EMERGENCY DEPARTMENT AT [facility] Provider Note   CSN: [id number] Arrival date & time: 04/14/23  1127     History  Chief Complaint  Patient presents with   Headache   Nausea    Madeline Gomez is a 43 y.o. female.  HPI 43 year old female presents today complaining of sudden onset of headache this a.m.  She reports that she had an orgasm and had sudden onset of headache that began in the frontal area.  She had associated nausea.  Pain now radiates to the back of her neck.  Denies any vision changes.  She states that she has had Wigraine headaches in the past but they are not common for her.  She has never had a headache like this.  She denies any lateralized weakness, numbness, tingling or vision changes.     Home Medications Prior to Admission medications   Medication Sig Start Date End Date Taking? Authorizing Provider  albuterol (ACCUNEB) 0.63 MG/3ML nebulizer solution Take 3 mLs (0.63 mg total) by nebulization every 6 (six) hours as needed for wheezing. 03/28/22   Parrett, Virgel Bouquet, NP  albuterol (VENTOLIN HFA) 108 (90 Base) MCG/ACT inhaler Inhale 2 puffs into the lungs every 4 (four) hours as needed for wheezing. 03/28/22   Parrett, Virgel Bouquet, NP  cetirizine (ZYRTEC) 10 MG tablet Take 10 mg by mouth daily.    [provider]  fluticasone furoate-vilanterol (BREO ELLIPTA) 100-25 MCG/ACT AEPB Inhale 1 puff into the lungs daily. 03/28/22   Parrett, Virgel Bouquet, NP  mometasone-formoterol (DULERA) 100-5 MCG/ACT AERO Inhale 2 puffs into the lungs in the morning and at bedtime. 04/05/22   Parrett, Virgel Bouquet, NP  OVER THE COUNTER MEDICATION daily. OTC immune support    [provider]      Allergies    Other, Percocet [oxycodone-acetaminophen], and Prednisone    Review of Systems   Review of Systems  Physical Exam Updated Vital Signs BP (!) 140/85 (BP Location: Right Arm)   Pulse 85   Temp 98 F (36.7 C) (Oral)   Resp 13   SpO2 97%  Physical  Exam Vitals reviewed.  Constitutional:      General: She is not in acute distress.    Appearance: She is well-developed.  HENT:     Head: Normocephalic.     Mouth/Throat:     Mouth: Mucous membranes are moist.  Eyes:     Extraocular Movements: Extraocular movements intact.     Right eye: Normal extraocular motion.     Left eye: Normal extraocular motion.     Comments: Photophobia on exam  Cardiovascular:     Rate and Rhythm: Normal rate and regular rhythm.     Heart sounds: Normal heart sounds.  Pulmonary:     Effort: Pulmonary effort is normal.  Abdominal:     Palpations: Abdomen is soft.  Musculoskeletal:        General: Normal range of motion.     Cervical back: Normal range of motion and neck supple.  Skin:    General: Skin is warm and dry.     Capillary Refill: Capillary refill takes less than 2 seconds.  Neurological:     Mental Status: She is alert.     Cranial Nerves: No cranial nerve deficit or facial asymmetry.     Sensory: No sensory deficit.     Motor: No weakness.     Coordination: Coordination normal.  Psychiatric:        Mood and  Affect: Mood normal.        Speech: Speech normal.        Behavior: Behavior normal.     ED Results / Procedures / Treatments   Labs (all labs ordered are listed, but only abnormal results are displayed) Labs Reviewed  CBC  HCG, SERUM, QUALITATIVE  BASIC METABOLIC PANEL    EKG None  Radiology CT Head Wo Contrast Result Date: 04/14/2023 CLINICAL DATA:  Provided history: Headache, sudden, severe. Sudden onset frontal/facial headache. Nausea. History of migraines and asthma. EXAM: CT HEAD WITHOUT CONTRAST TECHNIQUE: Contiguous axial images were obtained from the base of the skull through the vertex without intravenous contrast. RADIATION DOSE REDUCTION: This exam was performed according to the departmental dose-optimization program which includes automated exposure control, adjustment of the mA and/or kV according to patient  size and/or use of iterative reconstruction technique. COMPARISON:  None. FINDINGS: Brain: No age-advanced or lobar predominant cerebral atrophy. Moderate-volume acute subarachnoid hemorrhage, predominantly within the basal cisterns. Mild ventricular prominence suggesting early hydrocephalus. No demarcated cortical infarct. No extra-axial fluid collection. No evidence of an intracranial mass. No midline shift. Vascular: No hyperdense vessel. Skull: No calvarial fracture or aggressive osseous lesion. Sinuses/Orbits: No mass or acute finding within the imaged orbits. Minimal mucosal thickening within the bilateral maxillary sinuses at the imaged levels. These results were called by telephone at the time of interpretation on 04/14/2023 at 4:15 pm to provider Dr. Rosalia Hammers, who verbally acknowledged these results. IMPRESSION: 1. 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 CTA recommended for further evaluation. 2. Mild ventricular prominence consistent with early hydrocephalus. Prior of eye Electronically Signed   By: Jackey Loge D.O.   On: 04/14/2023 16:24    Procedures .Critical Care  Performed by: Margarita Grizzle, MD Authorized by: Margarita Grizzle, MD   Critical care provider statement:    Critical care time (minutes):  60   Critical care end time:  04/14/2023 4:30 PM   Critical care time was exclusive of:  Separately billable procedures and treating other patients   Critical care was necessary to treat or prevent imminent or life-threatening deterioration of the following conditions:  CNS failure or compromise   Critical care was time spent personally by me on the following activities:  Development of treatment plan with patient or surrogate, discussions with consultants, evaluation of patient's response to treatment, examination of patient, ordering and review of laboratory studies, ordering and review of radiographic studies, ordering and performing  treatments and interventions, pulse oximetry, re-evaluation of patient's condition and review of old charts     Medications Ordered in ED Medications  sodium chloride 0.9 % bolus 1,000 mL (1,000 mLs Intravenous Incomplete 04/14/23 1444)  metoCLOPramide (REGLAN) injection 10 mg (10 mg Intravenous Given 04/14/23 1437)  diphenhydrAMINE (BENADRYL) injection 25 mg (25 mg Intravenous Given 04/14/23 1435)  prochlorperazine (COMPAZINE) injection 10 mg (10 mg Intravenous Given 04/14/23 1436)    ED Course/ Medical Decision Making/ A&P Clinical Course as of 04/14/23 1636  Mon Apr 14, 2023  1444 CBC is reviewed and interpreted and normal Serum pregnancy test is negative [DR]  1444 Basic metabolic panel [DR]  1615 CT reviewed and concern for subarachnoid blood noted Placed call to radiology and they confirmed Call placed to neurosurgery and CT a of head ordered  [DR]    Clinical Course User Index [DR] Margarita Grizzle, MD  Medical Decision Making Amount and/or Complexity of Data Reviewed Labs:  Decision-making details documented in ED Course. Radiology: ordered.  Risk Prescription drug management.   43 year old female sudden onset of headache this a.m. Differential diagnosis includes but is not limited to subarachnoid hemorrhage, other acute intracranial abnormality including masses, migraine headaches, tension headache.  Patient denies any trauma. CT of head pending Patient was initially seen by Dr. Clarice Pole and had Reglan and Benadryl ordered.  I have ordered Compazine  CT positive for subarachnoid blood. Consult has been placed to neurosurgery CTA has been ordered Patient is awake and alert.    4:36 PM Care discussed with Dr. Wynetta Emery, who will be in to see patient.   Discussed diagnosis with patient  She remains awake and alert Code medical initiated to escalate care and imaging         Final Clinical Impression(s) / ED Diagnoses Final diagnoses:   Subarachnoid hematoma, without loss of consciousness, initial encounter [facility])    Rx / DC Orders ED Discharge Orders     None         Margarita Grizzle, MD 04/14/23 1636

## 2023-04-15 ENCOUNTER — Inpatient Hospital Stay (HOSPITAL_COMMUNITY)

## 2023-04-15 ENCOUNTER — Encounter (HOSPITAL_COMMUNITY): Payer: Self-pay | Admitting: Neurosurgery

## 2023-04-15 ENCOUNTER — Encounter (HOSPITAL_COMMUNITY)
Admission: EM | Disposition: A | Discharge status: Home / Self Care | Payer: Self-pay | Source: Home / Self Care | Attending: Neurosurgery

## 2023-04-15 HISTORY — PX: RADIOLOGY WITH ANESTHESIA: SHX6223

## 2023-04-15 HISTORY — PX: IR ANGIO INTRA EXTRACRAN SEL INTERNAL CAROTID BILAT MOD SED: IMG5363

## 2023-04-15 HISTORY — PX: IR US GUIDE VASC ACCESS RIGHT: IMG2390

## 2023-04-15 HISTORY — PX: IR ANGIO VERTEBRAL SEL VERTEBRAL BILAT MOD SED: IMG5369

## 2023-04-15 LAB — MRSA NEXT GEN BY PCR, NASAL: MRSA by PCR Next Gen: NOT DETECTED

## 2023-04-15 LAB — HIV ANTIBODY (ROUTINE TESTING W REFLEX): HIV Screen 4th Generation wRfx: NONREACTIVE

## 2023-04-15 SURGERY — RADIOLOGY WITH ANESTHESIA
Anesthesia: General

## 2023-04-15 MED ORDER — ORAL CARE MOUTH RINSE: 15.0000 mL | Freq: Once | OROMUCOSAL | Status: AC

## 2023-04-15 MED ORDER — CHLORHEXIDINE GLUCONATE 0.12 % MT SOLN
OROMUCOSAL | Status: AC
  Administered 2023-04-15: 15 mL via OROMUCOSAL
  Filled 2023-04-15: qty 15

## 2023-04-15 MED ORDER — FENTANYL CITRATE (PF) 250 MCG/5ML IJ SOLN
INTRAMUSCULAR | Status: DC | PRN
  Administered 2023-04-15: 25 ug via INTRAVENOUS

## 2023-04-15 MED ORDER — MIDAZOLAM HCL 2 MG/2ML IJ SOLN
INTRAMUSCULAR | Status: AC
  Filled 2023-04-15: qty 2

## 2023-04-15 MED ORDER — CHLORHEXIDINE GLUCONATE 0.12 % MT SOLN: 15.0000 mL | Freq: Once | OROMUCOSAL | Status: AC

## 2023-04-15 MED ORDER — LIDOCAINE HCL 1 % IJ SOLN
20.0000 mL | Freq: Once | INTRAMUSCULAR | Status: AC
  Administered 2023-04-15: 10 mL via INTRADERMAL

## 2023-04-15 MED ORDER — LACTATED RINGERS IV SOLN: INTRAVENOUS | Status: DC

## 2023-04-15 MED ORDER — LIDOCAINE HCL 1 % IJ SOLN
INTRAMUSCULAR | Status: AC
Start: 2023-04-15 — End: ?
  Filled 2023-04-15: qty 20

## 2023-04-15 MED ORDER — FENTANYL CITRATE (PF) 100 MCG/2ML IJ SOLN
INTRAMUSCULAR | Status: AC
  Filled 2023-04-15: qty 2

## 2023-04-15 MED ORDER — IOHEXOL 300 MG/ML  SOLN
150.0000 mL | Freq: Once | INTRAMUSCULAR | Status: AC | PRN
  Administered 2023-04-15: 50 mL via INTRA_ARTERIAL

## 2023-04-15 MED ORDER — MIDAZOLAM HCL 2 MG/2ML IJ SOLN
INTRAMUSCULAR | Status: DC | PRN
  Administered 2023-04-15: 1 mg via INTRAVENOUS

## 2023-04-15 MED ORDER — SODIUM CHLORIDE 0.9 % IV SOLN
INTRAVENOUS | Status: AC
Start: 2023-04-15 — End: 2023-04-16

## 2023-04-15 NOTE — Anesthesia Preprocedure Evaluation (Addendum)
 Anesthesia Evaluation  Patient identified by MRN, date of birth, ID band Patient awake    Reviewed: Allergy & Precautions, NPO status , Patient's Chart, lab work & pertinent test results, reviewed documented beta blocker date and time   History of Anesthesia Complications Negative for: history of anesthetic complications  Airway Mallampati: III  TM Distance: >3 FB    Comment: Nose ring, lower lip piercing which she states is not removable.  Dental no notable dental hx.    Pulmonary neg shortness of breath, asthma , neg sleep apnea, neg COPD, neg recent URI   breath sounds clear to auscultation       Cardiovascular (-) hypertension(-) angina (-) CAD and (-) Past MI negative cardio ROS  Rhythm:Regular Rate:Normal     Neuro/Psych neg Seizures SAH  negative psych ROS   GI/Hepatic negative GI ROS, Neg liver ROS,neg GERD  ,,(+) neg Cirrhosis        Endo/Other  negative endocrine ROS    Renal/GU Renal diseasenegative Renal ROS  negative genitourinary   Musculoskeletal negative musculoskeletal ROS (+)    Abdominal   Peds  Hematology  (+) Blood dyscrasia, anemia Lab Results      Component                Value               Date                      WBC                      10.2                04/14/2023                HGB                      13.3                04/14/2023                HCT                      41.6                04/14/2023                MCV                      85.4                04/14/2023                PLT                      297                 04/14/2023              Anesthesia Other Findings   Reproductive/Obstetrics                             Anesthesia Physical Anesthesia Plan  ASA: 3  Anesthesia Plan: MAC   Post-op Pain Management: Tylenol PO (pre-op)*   Induction: Intravenous  PONV Risk Score and Plan: 3 and Ondansetron, Dexamethasone, Midazolam and  Treatment may vary  due to age or medical condition  Airway Management Planned: Mask and Oral ETT  Additional Equipment: Arterial line  Intra-op Plan:   Post-operative Plan: Extubation in OR  Informed Consent: I have reviewed the patients History and Physical, chart, labs and discussed the procedure including the risks, benefits and alternatives for the proposed anesthesia with the patient or authorized representative who has indicated his/her understanding and acceptance.     Dental advisory given  Plan Discussed with: CRNA  Anesthesia Plan Comments:        Anesthesia Quick Evaluation

## 2023-04-15 NOTE — Sedation Documentation (Signed)
 Patient moved to table by staff , secured, hooked to monitors, and now under the care of anesthesia. Please see charting in vitals per CRNA.

## 2023-04-15 NOTE — Progress Notes (Signed)
 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  Admitted to the neuro ICU for monitoring.  Patient is mildly tachypneic but otherwise vital signs are within normal limits.  Saturating 91% on room air.  Results consistent with sodium of 139.  Otherwise unremarkable labs.  Extensive subarachnoid hemorrhage noted on CT imaging.  eICU Interventions  Neuroprotective measures with head of bed elevation, nimodipine  Seizure prophylaxis with Keppra.  Strict SBP control, goal SBP 130-150.  Nicardipine ordered.  Interval CT per neurosurgery.  Currently protecting her airway, monitor for neurological changes  DVT prophylaxis with SCDs GI prophylaxis with pantoprazole     Intervention Category Evaluation Type: New Patient Evaluation  Zaydin Billey 04/15/2023, 12:03 AM

## 2023-04-15 NOTE — Progress Notes (Signed)
  NEUROSURGERY PROGRESS NOTE   No issues overnight. History reviewed in EMR and with patient.  Briefly, the patient notes sudden onset of severe headache yesterday when getting out of the shower.  She was complaining of some neck pain and generalized weakness at the time.  Ultimately she called 911 and was brought to the emergency department where CT scan demonstrated diffuse subarachnoid hemorrhage.  Overnight her headache has significantly improved.  She does not report any associated visual changes or numbness tingling or weakness of the extremities.  Of note, the patient denies any significant medical history including hypertension, diabetes, heart disease, or previous stroke.  No known lung, liver, kidney disease.  She does not report any cancer history.  She is a non-smoker.  There is no known family history of intracranial aneurysms or unexplained intracranial hemorrhage.  EXAM:  BP 116/67   Pulse 71   Temp 98.5 F (36.9 C) (Oral)   Resp 12   Ht 5\' 3"  (1.6 m)   Wt 99.3 kg   SpO2 94%   BMI 38.78 kg/m   Awake, alert, oriented  Speech fluent, appropriate  CN grossly intact  5/5 BUE/BLE   IMAGING: CT scan of the head was personally reviewed and demonstrates diffuse basal subarachnoid hemorrhage perhaps slightly eccentric to the right including the ambient cistern and suprasellar cistern.  There is mild prominence of the temporal horns but no overt hydrocephalus.  CT angiogram of the head also personally reviewed as well as radiologist interpretation.  There is some concern for aneurysm at the A1 A2 junction on the right although not definitive.  IMPRESSION:  43 y.o. 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 I explained to her that intracranial aneurysm was the most common  non-traumatic cause for [facility] and that the definitive diagnosis is made by diagnostic angiogram. I also explained to them the possible treatment options for intracranial aneurysms including endovascular coiling, open clip ligation, and unlikely possibility of flow-diversion requiring anti-platelet therapy. The risks of the angiogram, coiling, and surgical clipping were also reviewed to include stroke and aneurysm re-rupture leading to weakness/paralysis/coma/death, infection, SZ, hydrocephalus.   The patient appeared to understand our discussion and provided consent to proceed with diagnostic angiogram and the appropriate treatment for any identified aneurysm.   Lisbeth Renshaw, MD [facility]

## 2023-04-15 NOTE — Sedation Documentation (Signed)
 Patient transported to 4N32 via bed with CRNA. Bedside report given to RN. Femoral site assessed - Level 0, no hematoma, dressing is clean, dry, and intact. Pulses also assessed bilaterally.

## 2023-04-15 NOTE — Brief Op Note (Signed)
  NEUROSURGERY BRIEF OPERATIVE  NOTE   PREOP DX: Subarachnoid Hemorrhage  POSTOP DX: Same  PROCEDURE:Diagnostic Cerebral Angiogram  SURGEON: Dr. Lisbeth Renshaw, MD  ANESTHESIA: IV Sedation with Local  APPROACH: Right trans-femoral  EBL: Minimal  SPECIMENS: None  COMPLICATIONS: None  CONDITION: Stable to ICU  FINDINGS (Full report in [facility]): 1. Normal cerebral angiogram, no identified aneurysm, AVM, or fistula seen. Multiple views of the Acom complex did not reveal an aneurysm to correlate with CTA findings. 2. No vasospasm is seen   Lisbeth Renshaw, MD [facility]

## 2023-04-15 NOTE — TOC CM/SW Note (Signed)
 Transition of Care [facility]) - Inpatient Brief Assessment   Patient Details  Name: Madeline Gomez MRN: [id number] Date of Birth: July 10, 1980  Transition of Care [facility]) CM/SW Contact:    Glennon Mac, RN Phone Number: 04/15/2023, 4:28 PM   Clinical Narrative:  43 year old female who had sudden onset worst headache of her life associated with dizziness this morning around 10:00 called EMS husband was at work.  Patient brought to the emergency department was noted to be awake and alert CT scan showed subarachnoid hemorrhage and patient was sent for CT angiogram.  Patient on Nimotop and monitoring for vasospasms. TOC will follow for discharge needs.    Transition of Care Asessment: Insurance and Status: Insurance coverage has been reviewed Patient has primary care physician: No Home environment has been reviewed: Lives with significant other Prior level of function:: Independent Prior/Current Home Services: No current home services Social Drivers of Health Review: SDOH reviewed no interventions necessary Readmission risk has been reviewed: Yes Transition of care needs: no transition of care needs at this time]  Quintella Baton, RN, BSN  Trauma/Neuro ICU Case Manager [phone]

## 2023-04-15 NOTE — Anesthesia Postprocedure Evaluation (Signed)
 Anesthesia Post Note  Patient: Madeline Gomez  Procedure(s) Performed: RADIOLOGY WITH ANESTHESIA     Patient location during evaluation: PACU Anesthesia Type: MAC Level of consciousness: awake and alert Pain management: pain level controlled Vital Signs Assessment: post-procedure vital signs reviewed and stable Respiratory status: spontaneous breathing, nonlabored ventilation, respiratory function stable and patient connected to nasal cannula oxygen Cardiovascular status: stable and blood pressure returned to baseline Postop Assessment: no apparent nausea or vomiting Anesthetic complications: no   No notable events documented.  Last Vitals:  Vitals:   04/15/23 1200 04/15/23 1515  BP: 106/61 (!) 133/93  Pulse: 82 82  Resp: 20 20  Temp: 36.4 C   SpO2: 96% 98%    Last Pain:  Vitals:   04/15/23 1515  TempSrc:   PainSc: 5                  Mariann Barter

## 2023-04-15 NOTE — Transfer of Care (Signed)
 Immediate Anesthesia Transfer of Care Note  Patient: Madeline Gomez  Procedure(s) Performed: RADIOLOGY WITH ANESTHESIA  Patient Location: ICU  Anesthesia Type:MAC  Level of Consciousness: awake, alert , and oriented  Airway & Oxygen Therapy: Patient Spontanous Breathing  Post-op Assessment: Report given to RN and Post -op Vital signs reviewed and stable  Post vital signs: Reviewed and stable  Last Vitals:  Vitals Value Taken Time  BP 133/93 04/15/23 1516  Temp    Pulse 60 04/15/23 1520  Resp 15 04/15/23 1520  SpO2 98 % 04/15/23 1520  Vitals shown include unfiled device data.  Last Pain:  Vitals:   04/15/23 1336  TempSrc:   PainSc: 3       Patients Stated Pain Goal: 0 (04/15/23 1336)  Complications: No notable events documented.

## 2023-04-15 NOTE — Progress Notes (Signed)
 Subjective: Patient reports feeling much better less headache  Objective: Vital signs in last 24 hours: Temp:  [98 F (36.7 C)-100.3 F (37.9 C)] 98.5 F (36.9 C) (03/11 0400) Pulse Rate:  [62-105] 71 (03/11 0621) Resp:  [12-21] 17 (03/11 0621) BP: (92-147)/(52-98) 116/75 (03/11 0600) SpO2:  [92 %-100 %] 96 % (03/11 0621) Weight:  [99.3 kg-104 kg] 99.3 kg (03/11 0621)  Intake/Output from previous day: 03/10 0701 - 03/11 0700 In: 391.7 [I.V.:291.7; IV Piggyback:100] Out: 500 [Urine:500] Intake/Output this shift: No intake/output data recorded.  Awake alert cranial nerves intact strength is 5 out of 5 no pronator drift  Lab Results: Recent Labs    04/14/23 1341 04/14/23 2237  WBC 7.3 10.2  HGB 13.8 13.3  HCT 43.0 41.6  PLT 242 297   BMET Recent Labs    04/14/23 1640  NA 139  K 4.0  CL 107  CO2 21*  GLUCOSE 92  BUN 6  CREATININE 0.82  CALCIUM 8.6*    Studies/Results: CT Angio Head W or Wo Contrast Result Date: 04/14/2023 CLINICAL DATA:  Sudden onset headache this morning, subarachnoid hemorrhage on CT concerning for aneurysm. Cerebral aneurysm, headache and nausea. EXAM: CT ANGIOGRAPHY HEAD TECHNIQUE: Multidetector CT imaging of the head was performed using the standard protocol during bolus administration of intravenous contrast. Multiplanar CT image reconstructions and MIPs were obtained to evaluate the vascular anatomy. RADIATION DOSE REDUCTION: This exam was performed according to the departmental dose-optimization program which includes automated exposure control, adjustment of the mA and/or kV according to patient size and/or use of iterative reconstruction technique. CONTRAST:  75mL OMNIPAQUE IOHEXOL 350 MG/ML SOLN COMPARISON:  CT head earlier same day. FINDINGS: CTA HEAD FINDINGS ANTERIOR CIRCULATION: The intracranial ICAs are patent bilaterally. No significant stenosis, proximal occlusion, or vascular malformation. MCAs: The middle cerebral arteries are patent  bilaterally. ACAs: Patent bilaterally. 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 POSTERIOR CIRCULATION: No significant stenosis, proximal occlusion, aneurysm, or vascular malformation. PCAs: Patent bilaterally.  Fetal origin of the left PCA. Pcomm: Visualized bilaterally. The left posterior communicating artery is slightly more prominent likely related to fetal origin of the left PCA without evidence of infundibulum or aneurysm. SCAs: The superior cerebellar arteries are patent bilaterally. Basilar artery: Patent AICAs: Patent PICAs: Patent Vertebral arteries: The intracranial vertebral arteries are patent. Venous sinuses: As permitted by contrast timing, patent. Anatomic variants: Fetal origin of the left PCA. 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 Patent intracranial arterial vasculature. Electronically Signed   By: Emily Filbert M.D.   On: 04/14/2023 20:04   CT Head Wo Contrast Result Date: 04/14/2023 CLINICAL DATA:  Provided history: Headache, sudden, severe. Sudden onset frontal/facial headache. Nausea. History of migraines and asthma. EXAM: CT HEAD WITHOUT CONTRAST TECHNIQUE: Contiguous axial images were obtained from the base of the skull through the vertex without intravenous contrast. RADIATION DOSE REDUCTION: This exam was performed according to the departmental dose-optimization program which includes automated exposure control, adjustment of the mA and/or kV according to patient size and/or use of iterative reconstruction technique. COMPARISON:  None. FINDINGS: Brain: No age-advanced or lobar predominant cerebral atrophy. Moderate-volume acute subarachnoid hemorrhage,  predominantly within the basal cisterns.  Mild ventricular prominence suggesting early hydrocephalus. No demarcated cortical infarct. No extra-axial fluid collection. No evidence of an intracranial mass. No midline shift. Vascular: No hyperdense vessel. Skull: No calvarial fracture or aggressive osseous lesion. Sinuses/Orbits: No mass or acute finding within the imaged orbits. Minimal mucosal thickening within the bilateral maxillary sinuses at the imaged levels. These results were called by telephone at the time of interpretation on 04/14/2023 at 4:15 pm to provider Dr. Rosalia Hammers, who verbally acknowledged these results. IMPRESSION: 1. 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 CTA recommended for further evaluation. 2. Mild ventricular prominence consistent with early hydrocephalus. Prior of eye Electronically Signed   By: Jackey Loge D.O.   On: 04/14/2023 16:24    Assessment/Plan: Subarachnoid hemorrhage day 1 grade 1 CT angio suggestive of a comm aneurysm.  Will discuss with Nundkumar timing of angio  LOS: 1 day     Mariam Dollar 04/15/2023, 7:51 AM

## 2023-04-15 NOTE — Progress Notes (Signed)
 Patient's blood pressure is less than 130 SBP. Paged Neurosurgery MD.

## 2023-04-16 ENCOUNTER — Encounter (HOSPITAL_COMMUNITY): Payer: Self-pay | Admitting: Neurosurgery

## 2023-04-16 DIAGNOSIS — I609 Nontraumatic subarachnoid hemorrhage, unspecified: Secondary | ICD-10-CM | POA: Diagnosis not present

## 2023-04-16 DIAGNOSIS — J45998 Other asthma: Secondary | ICD-10-CM

## 2023-04-16 MED ORDER — LEVETIRACETAM 500 MG PO TABS
500.0000 mg | ORAL_TABLET | Freq: Two times a day (BID) | ORAL | Status: DC
  Administered 2023-04-16 – 2023-04-23 (×15): 500 mg via ORAL
  Filled 2023-04-16 (×15): qty 1

## 2023-04-16 MED ORDER — PNEUMOCOCCAL 20-VAL CONJ VACC 0.5 ML IM SUSY: 0.5000 mL | PREFILLED_SYRINGE | INTRAMUSCULAR | Status: DC | PRN

## 2023-04-16 MED ORDER — PANTOPRAZOLE SODIUM 40 MG PO TBEC
40.0000 mg | DELAYED_RELEASE_TABLET | Freq: Every day | ORAL | Status: DC
  Administered 2023-04-16 – 2023-04-22 (×7): 40 mg via ORAL
  Filled 2023-04-16 (×7): qty 1

## 2023-04-16 MED ORDER — SODIUM CHLORIDE 0.9 % IV SOLN: INTRAVENOUS | Status: AC

## 2023-04-16 MED ORDER — ONDANSETRON HCL 4 MG/2ML IJ SOLN
4.0000 mg | Freq: Four times a day (QID) | INTRAMUSCULAR | Status: DC | PRN
Start: 2023-04-16 — End: 2023-04-23
  Administered 2023-04-16 – 2023-04-20 (×7): 4 mg via INTRAVENOUS
  Filled 2023-04-16 (×6): qty 2

## 2023-04-16 MED ORDER — ONDANSETRON HCL 4 MG/2ML IJ SOLN
INTRAMUSCULAR | Status: AC
  Filled 2023-04-16: qty 2

## 2023-04-16 NOTE — Consult Note (Signed)
 NAME:  Madeline Gomez, MRN:  [id number], DOB:  13-Dec-1980, LOS: 2 ADMISSION DATE:  04/14/2023, CONSULTATION DATE:  3/12 REFERRING MD:  Dr. Wynetta Emery, CHIEF COMPLAINT:  SAH   History of Present Illness:  43 year old female with PMH as below, which is significant for asthma followed in our pulmonary clinic. She presented to [facility] ED 3/12 with complaints of sudden onset headache immediately following an orgasm. Headache associated with nausea. Felt different from her traditional migraine. CT head positive for subarachnoid hemorrhage. Neurosurgery was consulted. CT angio suggestive of acom aneurysm. Admitted to ICU for normal SAH monitoring. She was taken to cerebral angiogram, which was normal. PCCM consulted for ICU assistance.   Pertinent  Medical History   has a past medical history of Anemia, Asthma, Broken bones, BV (bacterial vaginosis), H/O vitamin D deficiency, and Thyroid disease.   Significant Hospital Events: Including procedures, antibiotic start and stop dates in addition to other pertinent events   3/10 admit with [facility] 3/11 cerebral angiogram normal, no aneurysm seen. No vasospasm seen.   Interim History / Subjective:    Objective   Blood pressure 111/83, pulse 68, temperature 98.8 F (37.1 C), temperature source Oral, resp. rate 15, height 5\' 3"  (1.6 m), weight 99.3 kg, SpO2 96%.        Intake/Output Summary (Last 24 hours) at 04/16/2023 1143 Last data filed at 04/16/2023 1100 Gross per 24 hour  Intake 2234.44 ml  Output 2000 ml  Net 234.44 ml   Filed Weights   04/14/23 1948 04/15/23 5784  Weight: 104 kg 99.3 kg    Examination: General: middle aged female in NAD HENT: [place]/AT, PERRL, no JVD Lungs: No distress, clear Cardiovascular: RRR, no MRG Abdomen: Soft, NT, ND Extremities: No acute deformity or ROM limitation Neuro: Alert, oriented, non-focal  Resolved Hospital Problem list     Assessment & Plan:   Subarachnoid hemorrhage: CT angiogram concerning for  a comm aneurysm, but diagnostic angiography was negative.  - Supportive ICU care - SBP goal < - nimotop - tylenol for mild pain, PRN dilaudid for severe pain.  - Keppra for sz ppx.   Mild persistent chronic asthma: has not been compliant with home medications - Breo - PRN albuterol neb  Multinodular goiter - followed by endocrinology - no home medications - Check TSH with AM labs  Best Practice (right click and "Reselect all SmartList Selections" daily)   Diet/type: Regular consistency (see orders) DVT prophylaxis SCD Pressure ulcer(s): pressure ulcer assessment deferred  GI prophylaxis: PPI Lines: N/A Foley:  N/A Code Status:  full code Last date of multidisciplinary goals of care discussion [ ]   Labs   CBC: Recent Labs  Lab 04/14/23 1341 04/14/23 2237  WBC 7.3 10.2  HGB 13.8 13.3  HCT 43.0 41.6  MCV 86.2 85.4  PLT 242 297    Basic Metabolic Panel: Recent Labs  Lab 04/14/23 1640  NA 139  K 4.0  CL 107  CO2 21*  GLUCOSE 92  BUN 6  CREATININE 0.82  CALCIUM 8.6*   GFR: Estimated Creatinine Clearance: 98.4 mL/min (by C-G formula based on SCr of 0.82 mg/dL). Recent Labs  Lab 04/14/23 1341 04/14/23 2237  WBC 7.3 10.2    Liver Function Tests: No results for input(s): "AST", "ALT", "ALKPHOS", "BILITOT", "PROT", "ALBUMIN" in the last 168 hours. No results for input(s): "LIPASE", "AMYLASE" in the last 168 hours. No results for input(s): "AMMONIA" in the last 168 hours.  ABG No results found for: "  PHART", "PCO2ART", "PO2ART", "HCO3", "TCO2", "ACIDBASEDEF", "O2SAT"   Coagulation Profile: No results for input(s): "INR", "PROTIME" in the last 168 hours.  Cardiac Enzymes: No results for input(s): "CKTOTAL", "CKMB", "CKMBINDEX", "TROPONINI" in the last 168 hours.  HbA1C: No results found for: "HGBA1C"  CBG: No results for input(s): "GLUCAP" in the last 168 hours.  Review of Systems:   Bolds are positive  Constitutional: weight loss, gain,  night sweats, Fevers, chills, fatigue .  HEENT: headaches, Sore throat, sneezing, nasal congestion, post nasal drip, Difficulty swallowing, Tooth/dental problems, visual complaints visual changes, ear ache CV:  chest pain, radiates:,Orthopnea, PND, swelling in lower extremities, dizziness, palpitations, syncope.  GI  heartburn, indigestion, abdominal pain, nausea, vomiting, diarrhea, change in bowel habits, loss of appetite, bloody stools.  Resp: cough, productive: , hemoptysis, dyspnea, chest pain, pleuritic.  Skin: rash or itching or icterus GU: dysuria, change in color of urine, urgency or frequency. flank pain, hematuria  MS: joint pain or swelling. decreased range of motion  Psych: change in mood or affect. depression or anxiety.  Neuro: difficulty with speech, weakness, numbness, ataxia    Past Medical History:  She,  has a past medical history of Anemia, Asthma, Broken bones, BV (bacterial vaginosis), H/O vitamin D deficiency, and Thyroid disease.   Surgical History:   Past Surgical History:  Procedure Laterality Date   CESAREAN SECTION     WISDOM TOOTH EXTRACTION       Social History:   reports that she has never smoked. She has never used smokeless tobacco. She reports that she does not drink alcohol and does not use drugs.   Family History:  Her family history includes Anemia in her maternal grandmother; Asthma in her maternal grandmother; Cancer in her mother; Glaucoma in her paternal grandfather and paternal grandmother; Heart disease in her maternal grandmother; Hyperlipidemia in her father; Hypertension in her mother.   Allergies Allergies  Allergen Reactions   Other Hives and Other (See Comments)    PT IS ALLERGIC TO GRASS, CATS, TOMATOES, PINEAPPLES, POLLEN   Percocet [Oxycodone-Acetaminophen] Itching   Prednisone Itching    Rapid heart rate, made me feel bad     Home Medications  Prior to Admission medications   Medication Sig Start Date End Date Taking?  Authorizing Provider  albuterol (VENTOLIN HFA) 108 (90 Base) MCG/ACT inhaler Inhale 2 puffs into the lungs every 4 (four) hours as needed for wheezing. 03/28/22  Yes Parrett, Tammy S, NP  cetirizine (ZYRTEC) 10 MG tablet Take 10 mg by mouth daily as needed for rhinitis or allergies.   Yes [provider]  Cholecalciferol 1.25 MG (50000 UT) capsule Take 50,000 Units by mouth once a week. Thursday 03/13/23  Yes [provider]  levonorgestrel (MIRENA, 52 MG,) 20 MCG/DAY IUD 1 each by Intrauterine route once. 11/18/17  Yes [provider]  metroNIDAZOLE (METROCREAM) 0.75 % cream Apply 1 Application topically 2 (two) times daily.   Yes [provider]     Critical care time:      Joneen Roach, AGACNP-BC [facility]  See Amion for personal pager PCCM on call pager [phone] until 7pm. Please call Elink 7p-7a. [phone]  04/16/2023 1:29 PM

## 2023-04-16 NOTE — Progress Notes (Signed)
  NEUROSURGERY PROGRESS NOTE   Pt seen and examined. No issues overnight. Slightly worsened HA this am.  EXAM: Temp:  [97.6 F (36.4 C)-99.7 F (37.6 C)] 98.8 F (37.1 C) (03/12 0800) Pulse Rate:  [65-94] 86 (03/12 0900) Resp:  [13-29] 23 (03/12 0900) BP: (92-133)/(61-93) 113/71 (03/12 0900) SpO2:  [93 %-99 %] 97 % (03/12 0900) Intake/Output      03/11 0701 03/12 0700 03/12 0701 03/13 0700   P.O. 240    I.V. (mL/kg) 1698 (17.1) 100 (1)   IV Piggyback 200    Total Intake(mL/kg) 2138 (21.5) 100 (1)   Urine (mL/kg/hr) 2000 (0.8)    Total Output 2000    Net +138 +100         Awake, alert, oriented Speech fluent, appropriate CN grossly intact 5/5 BUE/BLE Right groin soft, no hematoma   LABS: Lab Results  Component Value Date   CREATININE 0.82 04/14/2023   BUN 6 04/14/2023   NA 139 04/14/2023   K 4.0 04/14/2023   CL 107 04/14/2023   CO2 21 (L) 04/14/2023   Lab Results  Component Value Date   WBC 10.2 04/14/2023   HGB 13.3 04/14/2023   HCT 41.6 04/14/2023   MCV 85.4 04/14/2023   PLT 297 04/14/2023    IMAGING: Angiogram yesterday negative for aneurysm/AVM/fistula. No spasm  IMPRESSION: - 44 y.o. female SAH d#2, angio negative. Neurologically intact  PLAN: - Cont Nimotop - TCD today - Mobilize ad lib   Lisbeth Renshaw, MD [facility]

## 2023-04-17 DIAGNOSIS — I609 Nontraumatic subarachnoid hemorrhage, unspecified: Secondary | ICD-10-CM | POA: Diagnosis not present

## 2023-04-17 DIAGNOSIS — J4530 Mild persistent asthma, uncomplicated: Secondary | ICD-10-CM

## 2023-04-17 LAB — BASIC METABOLIC PANEL
Anion gap: 9 (ref 5–15)
BUN: <5 mg/dL — ABNORMAL LOW (ref 6–20)
CO2: 25 mmol/L (ref 22–32)
Calcium: 8.1 mg/dL — ABNORMAL LOW (ref 8.9–10.3)
Chloride: 104 mmol/L (ref 98–111)
Creatinine, Ser: 0.75 mg/dL (ref 0.44–1.00)
GFR, Estimated: >60 mL/min (ref >60)
Glucose, Bld: 97 mg/dL (ref 70–99)
Potassium: 3 mmol/L — ABNORMAL LOW (ref 3.5–5.1)
Sodium: 138 mmol/L (ref 135–145)

## 2023-04-17 LAB — CBC
HCT: 35.9 % — ABNORMAL LOW (ref 36.0–46.0)
Hemoglobin: 11.8 g/dL — ABNORMAL LOW (ref 12.0–15.0)
MCH: 27.7 pg (ref 26.0–34.0)
MCHC: 32.9 g/dL (ref 30.0–36.0)
MCV: 84.3 fL (ref 80.0–100.0)
Platelets: 271 10*3/uL (ref 150–400)
RBC: 4.26 MIL/uL (ref 3.87–5.11)
RDW: 13.7 % (ref 11.5–15.5)
WBC: 9 10*3/uL (ref 4.0–10.5)
nRBC: 0 % (ref 0.0–0.2)

## 2023-04-17 LAB — TSH: TSH: 1.672 u[IU]/mL (ref 0.350–4.500)

## 2023-04-17 MED ORDER — DIPHENHYDRAMINE HCL 50 MG/ML IJ SOLN
50.0000 mg | Freq: Once | INTRAMUSCULAR | Status: AC
  Administered 2023-04-17: 50 mg via INTRAVENOUS
  Filled 2023-04-17: qty 1

## 2023-04-17 MED ORDER — BUTALBITAL-APAP-CAFFEINE 50-325-40 MG PO TABS
1.0000 | ORAL_TABLET | Freq: Four times a day (QID) | ORAL | Status: DC | PRN
  Administered 2023-04-17 – 2023-04-20 (×10): 1 via ORAL
  Filled 2023-04-17 (×11): qty 1

## 2023-04-17 MED ORDER — POTASSIUM CHLORIDE CRYS ER 20 MEQ PO TBCR
40.0000 meq | EXTENDED_RELEASE_TABLET | ORAL | Status: AC
  Administered 2023-04-17 (×2): 40 meq via ORAL
  Filled 2023-04-17 (×2): qty 2

## 2023-04-17 NOTE — Progress Notes (Signed)
   NAME:  Madeline Gomez, MRN:  [id number], DOB:  01-02-1981, LOS: 3 ADMISSION DATE:  04/14/2023, CONSULTATION DATE:  3/12 REFERRING MD:  Dr. Wynetta Emery, CHIEF COMPLAINT:  SAH   History of Present Illness:  43 year old female with PMH as below, which is significant for asthma followed in our pulmonary clinic. She presented to [facility] ED 3/12 with complaints of sudden onset headache immediately following an orgasm. Headache associated with nausea. Felt different from her traditional migraine. CT head positive for subarachnoid hemorrhage. Neurosurgery was consulted. CT angio suggestive of acom aneurysm. Admitted to ICU for normal SAH monitoring. She was taken to cerebral angiogram, which was normal. PCCM consulted for ICU assistance.   Pertinent  Medical History   has a past medical history of Anemia, Asthma, Broken bones, BV (bacterial vaginosis), H/O vitamin D deficiency, and Thyroid disease.   Significant Hospital Events: Including procedures, antibiotic start and stop dates in addition to other pertinent events   3/10 admit with [facility] 3/11 cerebral angiogram normal, no aneurysm seen. No vasospasm seen.   Interim History / Subjective:  No acute events overnight Still c/o headache mostly controlled with current regimen.  C/o photosensitivity and occasional dizziness.    Objective   Blood pressure 95/65, pulse 65, temperature 99.3 F (37.4 C), resp. rate 13, height 5\' 3"  (1.6 m), weight 99.3 kg, SpO2 95%.        Intake/Output Summary (Last 24 hours) at 04/17/2023 7829 Last data filed at 04/17/2023 0600 Gross per 24 hour  Intake 2253.41 ml  Output 600 ml  Net 1653.41 ml   Filed Weights   04/14/23 1948 04/15/23 0621  Weight: 104 kg 99.3 kg    Examination:  General: adult female in NAD HENT: [place]/AT, PERRL, no JVD Lungs: Clear bilateral breath sounds. Normal effort.  Cardiovascular: RRR, no MRG Abdomen: Soft, NT, ND Extremities: No acute deformity or ROM limitation.  Neuro: Alert,  oriented, non-focal  K 3.0 Hgb 11.8 Crit 35.9 TSH 1.672 Afebrile  Resolved Hospital Problem list     Assessment & Plan:   Subarachnoid hemorrhage: CT angiogram concerning for a comm aneurysm, but diagnostic angiography was negative.  - Supportive ICU care - Keep BP < - Nimodipine  - tylenol for mild pain, PRN dilaudid for severe pain.  - Keppra for sz ppx.   Mild persistent chronic asthma: has not been compliant with home medications - Breo - PRN albuterol neb  Multinodular goiter - followed by endocrinology - no home medications - TSH wnl  Nutrition: - Encourage PO intake, has had poor appetite and some nausea - Zofran PRN  Best Practice (right click and "Reselect all SmartList Selections" daily)   Diet/type: Regular consistency (see orders) DVT prophylaxis SCD Pressure ulcer(s): pressure ulcer assessment deferred  GI prophylaxis: PPI Lines: N/A Foley:  N/A Code Status:  full code Last date of multidisciplinary goals of care discussion [ ]    Critical care time:      Joneen Roach, AGACNP-BC [facility]  See Amion for personal pager PCCM on call pager [phone] until 7pm. Please call Elink 7p-7a. [phone]  04/17/2023 7:22 AM

## 2023-04-17 NOTE — Progress Notes (Signed)
 [facility] ADULT ICU REPLACEMENT PROTOCOL   The patient does apply for the [facility] Adult ICU Electrolyte Replacment Protocol based on the criteria listed below:   1.Exclusion criteria: TCTS, ECMO, Dialysis, and Myasthenia Gravis patients 2. Is GFR >/= 30 ml/min? Yes.    Patient's GFR today is >60 3. Is SCr </= 2? Yes.   Patient's SCr is 0.75 mg/dL 4. Did SCr increase >/= 0.5 in 24 hours? No. 5.Pt's weight >40kg  Yes.   6. Abnormal electrolyte(s):   K 3.0  7. Electrolytes replaced per protocol 8.  Call MD STAT for K+ </= 2.5, Phos </= 1, or Mag </= 1 Physician:  Shawn Stall R Riven Mabile 04/17/2023 6:27 AM

## 2023-04-17 NOTE — Progress Notes (Signed)
 Subjective: Patient reports condition a headache otherwise nonfocal  Objective: Vital signs in last 24 hours: Temp:  [98.1 F (36.7 C)-99.8 F (37.7 C)] 99.3 F (37.4 C) (03/13 0400) Pulse Rate:  [65-98] 65 (03/13 0700) Resp:  [11-23] 13 (03/13 0700) BP: (95-136)/(60-102) 95/65 (03/13 0700) SpO2:  [95 %-98 %] 95 % (03/13 0700)  Intake/Output from previous day: 03/12 0701 - 03/13 0700 In: 2353.3 [P.O.:240; I.V.:2113.3] Out: 600 [Urine:600] Intake/Output this shift: No intake/output data recorded.  Awake alert oriented strength 5 out of 5 no pronator drift  Lab Results: Recent Labs    04/14/23 2237 04/17/23 0434  WBC 10.2 9.0  HGB 13.3 11.8*  HCT 41.6 35.9*  PLT 297 271   BMET Recent Labs    04/14/23 1640 04/17/23 0434  NA 139 138  K 4.0 3.0*  CL 107 104  CO2 21* 25  GLUCOSE 92 97  BUN 6 <5*  CREATININE 0.82 0.75  CALCIUM 8.6* 8.1*    Studies/Results: No results found.  Assessment/Plan: Subarachnoid hemorrhage day 4 continue hydration observation initial angio negative follow-up angio pending, hypokalemia being addressed  LOS: 3 days     Madeline Gomez 04/17/2023, 8:14 AM

## 2023-04-18 DIAGNOSIS — S066X0A Traumatic subarachnoid hemorrhage without loss of consciousness, initial encounter: Secondary | ICD-10-CM

## 2023-04-18 LAB — BASIC METABOLIC PANEL
Anion gap: 5 (ref 5–15)
BUN: <5 mg/dL — ABNORMAL LOW (ref 6–20)
CO2: 25 mmol/L (ref 22–32)
Calcium: 8.4 mg/dL — ABNORMAL LOW (ref 8.9–10.3)
Chloride: 103 mmol/L (ref 98–111)
Creatinine, Ser: 0.64 mg/dL (ref 0.44–1.00)
GFR, Estimated: >60 mL/min (ref >60)
Glucose, Bld: 107 mg/dL — ABNORMAL HIGH (ref 70–99)
Potassium: 3.5 mmol/L (ref 3.5–5.1)
Sodium: 133 mmol/L — ABNORMAL LOW (ref 135–145)

## 2023-04-18 LAB — MAGNESIUM: Magnesium: 1.6 mg/dL — ABNORMAL LOW (ref 1.7–2.4)

## 2023-04-18 MED ORDER — OXYCODONE HCL 5 MG PO TABS
5.0000 mg | ORAL_TABLET | ORAL | Status: DC | PRN
  Administered 2023-04-18 – 2023-04-20 (×11): 5 mg via ORAL
  Filled 2023-04-18 (×11): qty 1

## 2023-04-18 MED ORDER — STROKE: EARLY STAGES OF RECOVERY BOOK
Freq: Once | Status: AC
  Filled 2023-04-18: qty 1

## 2023-04-18 MED ORDER — DEXTROSE IN LACTATED RINGERS 5 % IV SOLN
INTRAVENOUS | Status: AC
Start: 2023-04-18 — End: 2023-04-19

## 2023-04-18 MED ORDER — SODIUM CHLORIDE 0.9% FLUSH: 10.0000 mL | INTRAVENOUS | Status: DC | PRN

## 2023-04-18 MED ORDER — SODIUM CHLORIDE 0.9% FLUSH
10.0000 mL | Freq: Two times a day (BID) | INTRAVENOUS | Status: DC
  Administered 2023-04-18 – 2023-04-22 (×10): 10 mL

## 2023-04-18 MED ORDER — MAGNESIUM SULFATE 2 GM/50ML IV SOLN
2.0000 g | Freq: Once | INTRAVENOUS | Status: AC
  Administered 2023-04-18: 2 g via INTRAVENOUS
  Filled 2023-04-18: qty 50

## 2023-04-18 NOTE — Progress Notes (Signed)
  NEUROSURGERY PROGRESS NOTE   Pt seen and examined. No issues overnight. Largely unchanged.  EXAM: Temp:  [98 F (36.7 C)-100.6 F (38.1 C)] 98.5 F (36.9 C) (03/14 0800) Pulse Rate:  [68-90] 74 (03/14 0700) Resp:  [12-25] 25 (03/14 0700) BP: (99-132)/(61-91) 109/68 (03/14 0700) SpO2:  [95 %-98 %] 95 % (03/14 0700) Intake/Output      03/13 0701 03/14 0700 03/14 0701 03/15 0700   P.O.     I.V. (mL/kg) 1998.9 (20.1)    Total Intake(mL/kg) 1998.9 (20.1)    Urine (mL/kg/hr)     Stool     Total Output     Net +1998.9         Urine Occurrence 7 x    Stool Occurrence 1 x     Awake, alert, oriented Speech fluent, appropriate CN grossly intact 5/5 BUE/BLE Right groin soft, no hematoma   LABS: Lab Results  Component Value Date   CREATININE 0.64 04/18/2023   BUN <5 (L) 04/18/2023   NA 133 (L) 04/18/2023   K 3.5 04/18/2023   CL 103 04/18/2023   CO2 25 04/18/2023   Lab Results  Component Value Date   WBC 9.0 04/17/2023   HGB 11.8 (L) 04/17/2023   HCT 35.9 (L) 04/17/2023   MCV 84.3 04/17/2023   PLT 271 04/17/2023      IMPRESSION: - 43 y.o. female SAH d#4, angio negative. Neurologically intact  PLAN: - Cont Nimotop - TCD today - Mobilize ad lib   Lisbeth Renshaw, MD [facility]

## 2023-04-18 NOTE — Progress Notes (Signed)
   NAME:  Madeline Gomez, MRN:  [id number], DOB:  02-12-1980, LOS: 4 ADMISSION DATE:  04/14/2023, CONSULTATION DATE:  3/12 REFERRING MD:  Dr. Wynetta Emery, CHIEF COMPLAINT:  SAH   History of Present Illness:  43 year old female with PMH as below, which is significant for asthma followed in our pulmonary clinic. She presented to [facility] ED 3/12 with complaints of sudden onset headache immediately following an orgasm. Headache associated with nausea. Felt different from her traditional migraine. CT head positive for subarachnoid hemorrhage. Neurosurgery was consulted. CT angio suggestive of acom aneurysm. Admitted to ICU for normal SAH monitoring. She was taken to cerebral angiogram, which was normal. PCCM consulted for ICU assistance.   Pertinent  Medical History   has a past medical history of Anemia, Asthma, Broken bones, BV (bacterial vaginosis), H/O vitamin D deficiency, and Thyroid disease.   Significant Hospital Events: Including procedures, antibiotic start and stop dates in addition to other pertinent events   3/10 admit with [facility] 3/11 cerebral angiogram normal, no aneurysm seen. No vasospasm seen.   Interim History / Subjective:  No acute events overnight Headache improved Still having nausea but treated well with Zofran Having some lightheadedness which is persistent.  Not drinking or eating much at all.    Objective   Blood pressure 108/68, pulse 66, temperature 99.1 F (37.3 C), temperature source Oral, resp. rate 19, height 5\' 3"  (1.6 m), weight 99.3 kg, SpO2 96%.        Intake/Output Summary (Last 24 hours) at 04/18/2023 1327 Last data filed at 04/18/2023 0900 Gross per 24 hour  Intake 1820.56 ml  Output --  Net 1820.56 ml   Filed Weights   04/14/23 1948 04/15/23 0621  Weight: 104 kg 99.3 kg    Examination:  General: Adult female in NAD HENT: [place]/AT, PERRL, no JVD Lungs: Clear Cardiovascular: RRR Abdomen: Soft, NT Extremities: No acute deformity or ROM limitation.   Neuro: Alert, oriented, non-focal  Na 133 K 3.5 Hgb 11.8 Crit 35.9 TSH 1.672 Afebrile  Resolved Hospital Problem list     Assessment & Plan:   Subarachnoid hemorrhage: CT angiogram concerning for a comm aneurysm, but diagnostic angiography was negative.  - Supportive ICU care - Keep BP < - Nimodipine  - tylenol for mild pain, Fioricent, oxycodone for moderate to severe pain.  - Keppra for sz ppx.   Mild persistent chronic asthma: has not been compliant with home medications - Breo - PRN albuterol neb  Multinodular goiter - followed by endocrinology - no home medications - TSH wnl  Nutrition: - Encourage PO intake, has had poor appetite and some nausea - Zofran PRN - Will start some maintenance IVF because I think she may be getting a little dry and is not motivated to take PO even when nausea is well controlled.   Hypomagnesemia  - Supp mag  Best Practice (right click and "Reselect all SmartList Selections" daily)   Diet/type: Regular consistency (see orders) DVT prophylaxis SCD Pressure ulcer(s): pressure ulcer assessment deferred  GI prophylaxis: PPI Lines: N/A Foley:  N/A Code Status:  full code Last date of multidisciplinary goals of care discussion [ ]    Critical care time:      Joneen Roach, AGACNP-BC [facility]  See Amion for personal pager PCCM on call pager [phone] until 7pm. Please call Elink 7p-7a. [phone]  04/18/2023 1:27 PM

## 2023-04-19 ENCOUNTER — Inpatient Hospital Stay (HOSPITAL_COMMUNITY)

## 2023-04-19 DIAGNOSIS — S066X0A Traumatic subarachnoid hemorrhage without loss of consciousness, initial encounter: Secondary | ICD-10-CM | POA: Diagnosis not present

## 2023-04-19 DIAGNOSIS — I609 Nontraumatic subarachnoid hemorrhage, unspecified: Secondary | ICD-10-CM

## 2023-04-19 LAB — BASIC METABOLIC PANEL
Anion gap: 7 (ref 5–15)
BUN: <5 mg/dL — ABNORMAL LOW (ref 6–20)
CO2: 26 mmol/L (ref 22–32)
Calcium: 8.2 mg/dL — ABNORMAL LOW (ref 8.9–10.3)
Chloride: 102 mmol/L (ref 98–111)
Creatinine, Ser: 0.69 mg/dL (ref 0.44–1.00)
GFR, Estimated: >60 mL/min (ref >60)
Glucose, Bld: 117 mg/dL — ABNORMAL HIGH (ref 70–99)
Potassium: 3.5 mmol/L (ref 3.5–5.1)
Sodium: 135 mmol/L (ref 135–145)

## 2023-04-19 LAB — MAGNESIUM: Magnesium: 1.9 mg/dL (ref 1.7–2.4)

## 2023-04-19 MED ORDER — BOOST / RESOURCE BREEZE PO LIQD CUSTOM
1.0000 | Freq: Three times a day (TID) | ORAL | Status: DC
  Administered 2023-04-19 – 2023-04-21 (×4): 1 via ORAL

## 2023-04-19 MED ORDER — ENSURE ENLIVE PO LIQD: 237.0000 mL | Freq: Two times a day (BID) | ORAL | Status: DC

## 2023-04-19 NOTE — Progress Notes (Addendum)
 Transcranial Doppler  Date POD PCO2 HCT BP  MCA ACA PCA OPHT SIPH VERT Basilar  3/15    113/65 Right  Left   86  106   -31  -31   -24  64   17  18   *  *   -14  -17   -24           Right  Left                                            Right  Left                                             Right  Left                                             Right  Left                                            Right  Left                                            Right  Left                                        *Unable to insonate [place] Ratio:  Right: 2.9 Left:2.65   MCA = Middle Cerebral Artery      OPHT = Opthalmic Artery     BASILAR = Basilar Artery   ACA = Anterior Cerebral Artery     SIPH = Carotid Siphon PCA = Posterior Cerebral Artery   VERT = Verterbral Artery                   Normal MCA = 62+\-12 ACA = 50+\-12 PCA = 42+\-23

## 2023-04-19 NOTE — Evaluation (Signed)
 Physical Therapy Evaluation Patient Details Name: Madeline Gomez MRN: [id number] DOB: 25-Feb-1980 Today's Date: 04/19/2023  History of Present Illness  Pt presenting 3/10 with frontal headache, dizziness, and nausea. 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 Now s/p radiology with anaesthesia in which no aneurysm was found. PMH significant for mild persistent asthma, thyroid disease, anemia.   Clinical Impression  Pt in bed upon arrival of PT, agreeable to evaluation at this time. Prior to admission the pt was completely independent, living with her children (45, 56, and 22) in a home with 3 stairs to enter and flight of stairs to bedroom/bathroom. The pt was able to complete bed mobility with increased time, and despite reaching for assist on initial sit-stand, was able to complete hallway ambulation with CGA to supervision and no DME. VSS with mobility, but after additional trip to bathroom at end of session, pt with x3 instances of dizziness needing UE support or minA to steady. No overt buckling or LOB in session. Will continue to follow acutely to progress mobility and complete stair training in preparation for anticipated d/c home.       If plan is discharge home, recommend the following: A little help with walking and/or transfers;Assistance with cooking/housework;Assist for transportation;Help with stairs or ramp for entrance   Can travel by private vehicle        Equipment Recommendations  (shower chair)  Recommendations for Other Services       Functional Status Assessment Patient has had a recent decline in their functional status and demonstrates the ability to make significant improvements in function in a reasonable and predictable amount of time.     Precautions / Restrictions Precautions Precautions: Fall Recall of Precautions/Restrictions: Intact Restrictions Weight Bearing Restrictions Per Provider Order: No      Mobility  Bed  Mobility Overal bed mobility: Modified Independent             General bed mobility comments: increased time, HOB elevated    Transfers Overall transfer level: Needs assistance Equipment used: None Transfers: Sit to/from Stand Sit to Stand: Min assist, Supervision           General transfer comment: initially pt reaching for PT hand for assist, then supervision    Ambulation/Gait Ambulation/Gait assistance: Contact guard assist, Supervision Gait Distance (Feet): 150 Feet Assistive device: None Gait Pattern/deviations: Step-through pattern, Decreased stride length, Drifts right/left Gait velocity: slowed Gait velocity interpretation: <1.31 ft/sec, indicative of household ambulator   General Gait Details: pt mostly stable with VSS and no drifting or LOB, after using bathroom, pt with x3 instances of dizziness (BP stable) and slight drifting   Modified Rankin (Stroke Patients Only) Modified Rankin (Stroke Patients Only) Pre-Morbid Rankin Score: No symptoms Modified Rankin: Moderately severe disability     Balance Overall balance assessment: Needs assistance Sitting-balance support: No upper extremity supported Sitting balance-Leahy Scale: Good     Standing balance support: No upper extremity supported Standing balance-Leahy Scale: Fair Standing balance comment: poor tolerance for balance challenge, but no UE support with gait                             Pertinent Vitals/Pain Pain Assessment Pain Assessment: 0-10 Pain Score: 4  Pain Location: headache Pain Descriptors / Indicators: Headache Pain Intervention(s): Limited activity within patient's tolerance, Monitored during session, Repositioned    Home Living Family/patient expects to be discharged to:: Private residence Living Arrangements: Children;Spouse/significant  other Available Help at Discharge: Family;Available PRN/intermittently Type of Home: House Home Access: Stairs to  enter Entrance Stairs-Rails: None Entrance Stairs-Number of Steps: 3 Alternate Level Stairs-Number of Steps: flight Home Layout: Two level;1/2 bath on main level;Bed/bath upstairs Home Equipment: Hand held shower head Additional Comments: pt lives with 45, 17, and 40 yo children    Prior Function Prior Level of Function : Independent/Modified Independent;Driving;Working/employed             Mobility Comments: independent ADLs Comments: independent, 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     Extremity/Trunk Assessment   Upper Extremity Assessment Upper Extremity Assessment: Overall WFL for tasks assessed    Lower Extremity Assessment Lower Extremity Assessment: Overall WFL for tasks assessed    Cervical / Trunk Assessment Cervical / Trunk Assessment: Normal (some neck stiffness due to pain)  Communication   Communication Communication: No apparent difficulties    Cognition Arousal: Alert Behavior During Therapy: WFL for tasks assessed/performed   PT - Cognitive impairments: No apparent impairments                         Following commands: Intact       Cueing Cueing Techniques: Verbal cues     General Comments General comments (skin integrity, edema, etc.): VSS on RA        Assessment/Plan    PT Assessment Patient needs continued PT services  PT Problem List Decreased range of motion;Decreased strength;Decreased activity tolerance;Decreased balance;Decreased mobility;Decreased cognition;Decreased coordination;Decreased safety awareness;Pain       PT Treatment Interventions DME instruction;Gait training;Stair training;Functional mobility training;Therapeutic activities;Therapeutic exercise;Balance training;Neuromuscular re-education;Patient/family education    PT Goals (Current goals can be found in the Care Plan section)  Acute Rehab PT Goals Patient Stated Goal: return home and to independence PT Goal Formulation: With patient Time For Goal  Achievement: 05/03/23 Potential to Achieve Goals: Good    Frequency Min 2X/week        AM-PAC PT "6 Clicks" Mobility  Outcome Measure Help needed turning from your back to your side while in a flat bed without using bedrails?: A Little Help needed moving from lying on your back to sitting on the side of a flat bed without using bedrails?: A Little Help needed moving to and from a bed to a chair (including a wheelchair)?: A Little Help needed standing up from a chair using your arms (e.g., wheelchair or bedside chair)?: A Little Help needed to walk in hospital room?: A Little Help needed climbing 3-5 steps with a railing? : A Little 6 Click Score: 18    End of Session Equipment Utilized During Treatment: Gait belt Activity Tolerance: Other (comment) (dizziness) Patient left: in bed;with call bell/phone within reach;with bed alarm set Nurse Communication: Mobility status PT Visit Diagnosis: Unsteadiness on feet (R26.81);Other abnormalities of gait and mobility (R26.89);Pain Pain - part of body:  (headache)    Time: 2956-2130 PT Time Calculation (min) (ACUTE ONLY): 36 min   Charges:   PT Evaluation $PT Eval Moderate Complexity: 1 Mod PT Treatments $Therapeutic Exercise: 8-22 mins PT General Charges $$ ACUTE PT VISIT: 1 Visit         Vickki Muff, PT, DPT   Acute Rehabilitation Department Office [phone] Secure Chat Communication Preferred  Ronnie Derby 04/19/2023, 6:12 PM

## 2023-04-19 NOTE — Progress Notes (Signed)
   NAME:  Madeline Gomez, MRN:  [id number], DOB:  11/17/1980, LOS: 5 ADMISSION DATE:  04/14/2023, CONSULTATION DATE:  3/12 REFERRING MD:  Dr. Wynetta Emery, CHIEF COMPLAINT:  SAH   History of Present Illness:  43 year old female with PMH as below, which is significant for asthma followed in our pulmonary clinic. She presented to [facility] ED 3/12 with complaints of sudden onset headache immediately following an orgasm. Headache associated with nausea. Felt different from her traditional migraine. CT head positive for subarachnoid hemorrhage. Neurosurgery was consulted. CT angio suggestive of acom aneurysm. Admitted to ICU for normal SAH monitoring. She was taken to cerebral angiogram, which was normal. PCCM consulted for ICU assistance.   Pertinent  Medical History   has a past medical history of Anemia, Asthma, Broken bones, BV (bacterial vaginosis), H/O vitamin D deficiency, and Thyroid disease.   Significant Hospital Events: Including procedures, antibiotic start and stop dates in addition to other pertinent events   3/10 admit with [facility] 3/11 cerebral angiogram normal, no aneurysm seen. No vasospasm seen.   Interim History / Subjective:  Headache has improved. Ambulating in room.  Objective   Blood pressure 118/69, pulse 83, temperature 98.9 F (37.2 C), temperature source Oral, resp. rate 13, height 5\' 3"  (1.6 m), weight 99.3 kg, SpO2 100%.        Intake/Output Summary (Last 24 hours) at 04/19/2023 1552 Last data filed at 04/19/2023 0815 Gross per 24 hour  Intake 1064.73 ml  Output --  Net 1064.73 ml   Filed Weights   04/14/23 1948 04/15/23 0621  Weight: 104 kg 99.3 kg    Examination:  General: Adult female in NAD, Sleeping HENT: [place]/AT, PERRL, no JVD Lungs: Clear Cardiovascular: HS normal Abdomen: Soft, NT Extremities: No acute deformity or ROM limitation.  Neuro: Alert, oriented, non-focal  Ancillary Tests;  TCD - no vasospasm  Assessment & Plan:   Subarachnoid  hemorrhage: CT angiogram concerning for a comm aneurysm, but diagnostic angiography was negative. Now post-ictus day 5.  - Nimodipine  - tylenol for mild pain, Fioricent, oxycodone for moderate to severe pain.  - Keppra for sz ppx.   Mild persistent chronic asthma: has not been compliant with home medications, no signs of exacerbation.  - Breo - PRN albuterol neb  Multinodular goiter - followed by endocrinology - no home medications - TSH wnl   Best Practice (right click and "Reselect all SmartList Selections" daily)   Diet/type: Regular consistency (see orders) DVT prophylaxis SCD Pressure ulcer(s): pressure ulcer assessment deferred  GI prophylaxis: PPI Lines: N/A Foley:  N/A Code Status:  full code Last date of multidisciplinary goals of care discussion [ ]   Lynnell Catalan, MD [facility] ICU Physician [facility] [facility]  Pager: [phone] Or Epic Secure Chat After hours: [phone].  04/19/2023, 3:58 PM

## 2023-04-19 NOTE — Progress Notes (Addendum)
 eLink Physician-Brief Progress Note Patient Name: Madeline Gomez DOB: 05/25/1980 MRN: [id number]   Date of Service  04/19/2023  HPI/Events of Note  Patient worried that she is not eating well. Requesting for liquid Ensure  eICU Interventions  Ordered Boost Breeze to start as per request Nutrition consult to assess nutritional status and make recommendations     Intervention Category Intermediate Interventions: Other:  Darl Pikes 04/19/2023, 8:34 PM

## 2023-04-19 NOTE — Progress Notes (Signed)
  NEUROSURGERY PROGRESS NOTE   Pt seen and examined. Had worsened HA last night.  EXAM: Temp:  [98 F (36.7 C)-101 F (38.3 C)] 98.9 F (37.2 C) (03/15 0800) Pulse Rate:  [66-86] 74 (03/15 0600) Resp:  [12-25] 13 (03/15 0600) BP: (107-137)/(62-84) 137/77 (03/15 0600) SpO2:  [94 %-98 %] 96 % (03/15 0600) Intake/Output      03/14 0701 03/15 0700 03/15 0701 03/16 0700   P.O.  60   I.V. (mL/kg) 1098.2 (11.1)    IV Piggyback 82.6    Total Intake(mL/kg) 1180.9 (11.9) 60 (0.6)   Net +1180.9 +60        Urine Occurrence 11 x 1 x    Awake, alert, oriented Speech fluent, appropriate CN grossly intact 5/5 BUE/BLE  LABS: Lab Results  Component Value Date   CREATININE 0.69 04/19/2023   BUN <5 (L) 04/19/2023   NA 135 04/19/2023   K 3.5 04/19/2023   CL 102 04/19/2023   CO2 26 04/19/2023   Lab Results  Component Value Date   WBC 9.0 04/17/2023   HGB 11.8 (L) 04/17/2023   HCT 35.9 (L) 04/17/2023   MCV 84.3 04/17/2023   PLT 271 04/17/2023      IMPRESSION: - 43 y.o. female SAH d#5, angio negative. Neurologically intact  PLAN: - Cont Nimotop - Mobilize ad lib - plan on angio early next week  Lisbeth Renshaw, MD [facility]

## 2023-04-19 NOTE — Progress Notes (Signed)
 VASCULAR LAB    TCD has been performed.  See CV proc for preliminary results.   Aviya Jarvie, RVT 04/19/2023, 10:18 AM

## 2023-04-20 DIAGNOSIS — S066X0A Traumatic subarachnoid hemorrhage without loss of consciousness, initial encounter: Secondary | ICD-10-CM | POA: Diagnosis not present

## 2023-04-20 MED ORDER — OXYCODONE HCL 5 MG PO TABS
5.0000 mg | ORAL_TABLET | ORAL | Status: DC | PRN
  Administered 2023-04-20 – 2023-04-21 (×5): 5 mg via ORAL
  Filled 2023-04-20 (×5): qty 1

## 2023-04-20 MED ORDER — DIAZEPAM 2 MG PO TABS
2.0000 mg | ORAL_TABLET | Freq: Four times a day (QID) | ORAL | Status: DC | PRN
Start: 2023-04-20 — End: 2023-04-23

## 2023-04-20 MED ORDER — OXYCODONE HCL 5 MG PO TABS: 10.0000 mg | ORAL_TABLET | ORAL | Status: DC | PRN

## 2023-04-20 MED ORDER — ADULT MULTIVITAMIN W/MINERALS CH
1.0000 | ORAL_TABLET | Freq: Every day | ORAL | Status: DC
  Administered 2023-04-20 – 2023-04-23 (×4): 1 via ORAL
  Filled 2023-04-20 (×4): qty 1

## 2023-04-20 MED ORDER — DEXAMETHASONE 4 MG PO TABS
4.0000 mg | ORAL_TABLET | Freq: Two times a day (BID) | ORAL | Status: DC
  Administered 2023-04-20 (×2): 4 mg via ORAL
  Filled 2023-04-20 (×2): qty 1

## 2023-04-20 NOTE — Progress Notes (Signed)
  NEUROSURGERY PROGRESS NOTE   Pt seen and examined. Again had worsened HA last night.  EXAM: Temp:  [98.6 F (37 C)-100.3 F (37.9 C)] 99.4 F (37.4 C) (03/16 0815) Pulse Rate:  [67-88] 72 (03/16 0700) Resp:  [9-23] 13 (03/16 0700) BP: (90-122)/(55-84) 112/78 (03/16 0700) SpO2:  [94 %-100 %] 94 % (03/16 0700) Intake/Output      03/15 0701 03/16 0700 03/16 0701 03/17 0700   P.O. 60    I.V. (mL/kg) 581.4 (5.9)    IV Piggyback     Total Intake(mL/kg) 641.4 (6.5)    Net +641.4         Urine Occurrence 9 x    Stool Occurrence 0 x     Awake, alert, oriented Speech fluent, appropriate CN grossly intact 5/5 BUE/BLE  LABS: Lab Results  Component Value Date   CREATININE 0.69 04/19/2023   BUN <5 (L) 04/19/2023   NA 135 04/19/2023   K 3.5 04/19/2023   CL 102 04/19/2023   CO2 26 04/19/2023   Lab Results  Component Value Date   WBC 9.0 04/17/2023   HGB 11.8 (L) 04/17/2023   HCT 35.9 (L) 04/17/2023   MCV 84.3 04/17/2023   PLT 271 04/17/2023      IMPRESSION: - 43 y.o. female SAH d#6, angio negative. Neurologically intact  PLAN: - Cont Nimotop - Mobilize ad lib - plan on angio likely tomorrow  Lisbeth Renshaw, MD [facility]

## 2023-04-20 NOTE — Progress Notes (Signed)
   NAME:  Madeline Gomez, MRN:  [id number], DOB:  1980-10-28, LOS: 6 ADMISSION DATE:  04/14/2023, CONSULTATION DATE:  3/12 REFERRING MD:  Dr. Wynetta Emery, CHIEF COMPLAINT:  SAH   History of Present Illness:  43 year old female with PMH as below, which is significant for asthma followed in our pulmonary clinic. She presented to [facility] ED 3/12 with complaints of sudden onset headache immediately following an orgasm. Headache associated with nausea. Felt different from her traditional migraine. CT head positive for subarachnoid hemorrhage. Neurosurgery was consulted. CT angio suggestive of acom aneurysm. Admitted to ICU for normal SAH monitoring. She was taken to cerebral angiogram, which was normal. PCCM consulted for ICU assistance.   Pertinent  Medical History   has a past medical history of Anemia, Asthma, Broken bones, BV (bacterial vaginosis), H/O vitamin D deficiency, and Thyroid disease.   Significant Hospital Events: Including procedures, antibiotic start and stop dates in addition to other pertinent events   3/10 admit with [facility] 3/11 cerebral angiogram normal, no aneurysm seen. No vasospasm seen.   Interim History / Subjective:  Continues to have headache.  Not eating much.  Objective   Blood pressure 125/77, pulse (!) 104, temperature 98.1 F (36.7 C), temperature source Oral, resp. rate 15, height 5\' 3"  (1.6 m), weight 99.3 kg, SpO2 97%.        Intake/Output Summary (Last 24 hours) at 04/20/2023 1909 Last data filed at 04/19/2023 2221 Gross per 24 hour  Intake 10 ml  Output --  Net 10 ml   Filed Weights   04/14/23 1948 04/15/23 0621  Weight: 104 kg 99.3 kg    Examination:  General: Adult female in NAD, Sleeping HENT: [place]/AT, PERRL, no JVD.  No occipital muscle tenderness Lungs: Clear Cardiovascular: HS normal Abdomen: Soft, NT Extremities: No acute deformity or ROM limitation.  Neuro: Alert, oriented, non-focal.  Ancillary Tests;  TCD - no vasospasm  Assessment &  Plan:   Subarachnoid hemorrhage: CT angiogram concerning for a comm aneurysm, but diagnostic angiography was negative. Now post-ictus day 5.  - Nimodipine  - tylenol for mild pain, oxycodone for moderate to severe pain.  Have added Valium for muscle spasm.  -Recommended to patient to get up out of bed and mobilize as this may be contributing to some of her occipital headache. - Keppra for sz ppx.  -Plan for angio tomorrow.  Mild persistent chronic asthma: has not been compliant with home medications, no signs of exacerbation.  - Breo - PRN albuterol neb  Multinodular goiter - followed by endocrinology - no home medications - TSH wnl   Best Practice (right click and "Reselect all SmartList Selections" daily)   Diet/type: Regular consistency (see orders) DVT prophylaxis SCD Pressure ulcer(s): pressure ulcer assessment deferred  GI prophylaxis: PPI Lines: N/A Foley:  N/A Code Status:  full code Last date of multidisciplinary goals of care discussion [ ]   Lynnell Catalan, MD [facility] ICU Physician [facility]  Pager: [phone] Or Epic Secure Chat After hours: [phone].  04/20/2023, 7:09 PM

## 2023-04-20 NOTE — Progress Notes (Signed)
 Initial Nutrition Assessment  DOCUMENTATION CODES:   Not applicable  INTERVENTION:  Multivitamin w/ minerals daily Continue Boost Breeze po TID, each supplement provides 250 kcal and 9 grams of protein Consider increasing bowel regimen due to no BM this admission  NUTRITION DIAGNOSIS:   Inadequate oral intake related to nausea as evidenced by  (per RN report).  GOAL:   Patient will meet greater than or equal to 90% of their needs  MONITOR:   PO intake, Supplement acceptance, Labs, Weight trends  REASON FOR ASSESSMENT:   Consult Assessment of nutrition requirement/status  ASSESSMENT:   43 y.o. female presented to the ED with headache and dizziness. PMH includes asthma. Pt admitted with subarachnoid hemorrhage.   3/10 - Admitted 3/11 - Op, s/p diagnostic cerebral angiogram; diet advanced to regular  RD working remotely at time of assessment.  Per MD notes, pt not taking in much PO. Discussed with RN, reports that pt is not taking in much PO due to severe nausea. Lunch tray should be arriving soon and will provide zofran to hopefully increase PO intake.   No weight history from the past year to assess for any weight changes.  Meal Intake 3/12: 20% x 1 meal 3/15: 0-100% x 2 meals  Medications reviewed and include: Protonix, Senokot-S Labs reviewed.   NUTRITION - FOCUSED PHYSICAL EXAM:  Deferred to follow-up.  Diet Order:   Diet Order             Diet regular Room service appropriate? Yes with Assist; Fluid consistency: Thin  Diet effective now                   EDUCATION NEEDS:   No education needs have been identified at this time  Skin:  Skin Assessment: Reviewed RN Assessment  Last BM:  PTA  Height:  Ht Readings from Last 1 Encounters:  04/14/23 5\' 3"  (1.6 m)   Weight:  Wt Readings from Last 1 Encounters:  04/15/23 99.3 kg   Ideal Body Weight:  52.3 kg  BMI:  Body mass index is 38.78 kg/m.  Estimated Nutritional Needs:  Kcal:   1950-2150 Protein:  100-120 grams Fluid:  >/= 1.9 L   Kirby Crigler RD, LDN Clinical Dietitian

## 2023-04-20 NOTE — Plan of Care (Signed)
 Problem: Education: Goal: Knowledge of General Education information will improve Description: Including pain rating scale, medication(s)/side effects and non-pharmacologic comfort measures Outcome: Progressing   Problem: Health Behavior/Discharge Planning: Goal: Ability to manage health-related needs will improve Outcome: Progressing   Problem: Clinical Measurements: Goal: Ability to maintain clinical measurements within normal limits will improve Outcome: Progressing Goal: Will remain free from infection Outcome: Progressing Goal: Diagnostic test results will improve Outcome: Progressing Goal: Respiratory complications will improve Outcome: Progressing Goal: Cardiovascular complication will be avoided Outcome: Progressing   Problem: Activity: Goal: Risk for activity intolerance will decrease Outcome: Progressing   Problem: Nutrition: Goal: Adequate nutrition will be maintained Outcome: Progressing   Problem: Coping: Goal: Level of anxiety will decrease Outcome: Progressing   Problem: Elimination: Goal: Will not experience complications related to bowel motility Outcome: Progressing Goal: Will not experience complications related to urinary retention Outcome: Progressing   Problem: Pain Managment: Goal: General experience of comfort will improve and/or be controlled Outcome: Progressing   Problem: Safety: Goal: Ability to remain free from injury will improve Outcome: Progressing   Problem: Skin Integrity: Goal: Risk for impaired skin integrity will decrease Outcome: Progressing   Problem: Education: Goal: Knowledge of disease or condition will improve Outcome: Progressing Goal: Knowledge of secondary prevention will improve (MUST DOCUMENT ALL) Outcome: Progressing Goal: Knowledge of patient specific risk factors will improve (DELETE if not current risk factor) Outcome: Progressing   Problem: Intracerebral Hemorrhage Tissue Perfusion: Goal: Complications  of Intracerebral Hemorrhage will be minimized Outcome: Progressing   Problem: Coping: Goal: Will verbalize positive feelings about self Outcome: Progressing Goal: Will identify appropriate support needs Outcome: Progressing   Problem: Health Behavior/Discharge Planning: Goal: Ability to manage health-related needs will improve Outcome: Progressing Goal: Goals will be collaboratively established with patient/family Outcome: Progressing   Problem: Self-Care: Goal: Ability to participate in self-care as condition permits will improve Outcome: Progressing Goal: Verbalization of feelings and concerns over difficulty with self-care will improve Outcome: Progressing Goal: Ability to communicate needs accurately will improve Outcome: Progressing   Problem: Nutrition: Goal: Risk of aspiration will decrease Outcome: Progressing Goal: Dietary intake will improve Outcome: Progressing   Problem: Safety: Goal: Non-violent Restraint(s) Outcome: Progressing   Problem: Education: Goal: Understanding of CV disease, CV risk reduction, and recovery process will improve Outcome: Progressing Goal: Individualized Educational Video(s) Outcome: Progressing   Problem: Activity: Goal: Ability to return to baseline activity level will improve Outcome: Progressing   Problem: Cardiovascular: Goal: Ability to achieve and maintain adequate cardiovascular perfusion will improve Outcome: Progressing Goal: Vascular access site(s) Level 0-1 will be maintained Outcome: Progressing   Problem: Health Behavior/Discharge Planning: Goal: Ability to safely manage health-related needs after discharge will improve Outcome: Progressing   Problem: Education: Goal: Knowledge of disease or condition will improve Outcome: Progressing Goal: Knowledge of secondary prevention will improve (MUST DOCUMENT ALL) Outcome: Progressing Goal: Knowledge of patient specific risk factors will improve (DELETE if not  current risk factor) Outcome: Progressing   Problem: Spontaneous Subarachnoid Hemorrhage Tissue Perfusion: Goal: Complications of Spontaneous Subarachnoid Hemorrhage will be minimized Outcome: Progressing   Problem: Coping: Goal: Will verbalize positive feelings about self Outcome: Progressing Goal: Will identify appropriate support needs Outcome: Progressing   Problem: Health Behavior/Discharge Planning: Goal: Ability to manage health-related needs will improve Outcome: Progressing Goal: Goals will be collaboratively established with patient/family Outcome: Progressing   Problem: Self-Care: Goal: Ability to participate in self-care as condition permits will improve Outcome: Progressing Goal: Verbalization of feelings and concerns over difficulty with self-care  will improve Outcome: Progressing Goal: Ability to communicate needs accurately will improve Outcome: Progressing   Problem: Nutrition: Goal: Risk of aspiration will decrease Outcome: Progressing Goal: Dietary intake will improve Outcome: Progressing

## 2023-04-20 NOTE — Plan of Care (Signed)
  Problem: Health Behavior/Discharge Planning: Goal: Ability to manage health-related needs will improve Outcome: Progressing   Problem: Clinical Measurements: Goal: Ability to maintain clinical measurements within normal limits will improve Outcome: Progressing Goal: Respiratory complications will improve Outcome: Progressing Goal: Cardiovascular complication will be avoided Outcome: Progressing   Problem: Activity: Goal: Risk for activity intolerance will decrease Outcome: Progressing   Problem: Coping: Goal: Level of anxiety will decrease Outcome: Progressing   Problem: Elimination: Goal: Will not experience complications related to urinary retention Outcome: Progressing   Problem: Pain Managment: Goal: General experience of comfort will improve and/or be controlled Outcome: Progressing

## 2023-04-21 ENCOUNTER — Telehealth (HOSPITAL_COMMUNITY): Payer: Self-pay | Admitting: Pharmacy Technician

## 2023-04-21 ENCOUNTER — Inpatient Hospital Stay (HOSPITAL_COMMUNITY)

## 2023-04-21 ENCOUNTER — Other Ambulatory Visit (HOSPITAL_COMMUNITY): Payer: Self-pay

## 2023-04-21 DIAGNOSIS — I609 Nontraumatic subarachnoid hemorrhage, unspecified: Secondary | ICD-10-CM

## 2023-04-21 LAB — BASIC METABOLIC PANEL
Anion gap: 10 (ref 5–15)
BUN: <5 mg/dL — ABNORMAL LOW (ref 6–20)
CO2: 25 mmol/L (ref 22–32)
Calcium: 8.9 mg/dL (ref 8.9–10.3)
Chloride: 102 mmol/L (ref 98–111)
Creatinine, Ser: 0.67 mg/dL (ref 0.44–1.00)
GFR, Estimated: >60 mL/min (ref >60)
Glucose, Bld: 112 mg/dL — ABNORMAL HIGH (ref 70–99)
Potassium: 3.8 mmol/L (ref 3.5–5.1)
Sodium: 137 mmol/L (ref 135–145)

## 2023-04-21 LAB — CBC
HCT: 35.5 % — ABNORMAL LOW (ref 36.0–46.0)
Hemoglobin: 11.6 g/dL — ABNORMAL LOW (ref 12.0–15.0)
MCH: 27.7 pg (ref 26.0–34.0)
MCHC: 32.7 g/dL (ref 30.0–36.0)
MCV: 84.7 fL (ref 80.0–100.0)
Platelets: 304 10*3/uL (ref 150–400)
RBC: 4.19 MIL/uL (ref 3.87–5.11)
RDW: 14 % (ref 11.5–15.5)
WBC: 8.7 10*3/uL (ref 4.0–10.5)
nRBC: 0 % (ref 0.0–0.2)

## 2023-04-21 LAB — MAGNESIUM: Magnesium: 2 mg/dL (ref 1.7–2.4)

## 2023-04-21 LAB — PHOSPHORUS: Phosphorus: 3.6 mg/dL (ref 2.5–4.6)

## 2023-04-21 MED ORDER — OXYCODONE HCL 5 MG PO TABS
5.0000 mg | ORAL_TABLET | ORAL | Status: DC | PRN
  Administered 2023-04-21 – 2023-04-23 (×9): 5 mg via ORAL
  Filled 2023-04-21 (×9): qty 1

## 2023-04-21 MED ORDER — POTASSIUM CHLORIDE CRYS ER 20 MEQ PO TBCR
20.0000 meq | EXTENDED_RELEASE_TABLET | Freq: Once | ORAL | Status: AC
  Administered 2023-04-21: 20 meq via ORAL
  Filled 2023-04-21: qty 1

## 2023-04-21 MED ORDER — KETOROLAC TROMETHAMINE 15 MG/ML IJ SOLN
15.0000 mg | Freq: Three times a day (TID) | INTRAMUSCULAR | Status: DC | PRN
  Administered 2023-04-21 – 2023-04-23 (×5): 15 mg via INTRAVENOUS
  Filled 2023-04-21 (×6): qty 1

## 2023-04-21 MED ORDER — POLYETHYLENE GLYCOL 3350 17 G PO PACK
17.0000 g | PACK | Freq: Every day | ORAL | Status: DC
  Administered 2023-04-21 – 2023-04-23 (×2): 17 g via ORAL
  Filled 2023-04-21 (×2): qty 1

## 2023-04-21 MED ORDER — HYDROXYZINE HCL 25 MG PO TABS
25.0000 mg | ORAL_TABLET | Freq: Three times a day (TID) | ORAL | Status: DC | PRN
  Administered 2023-04-21: 25 mg via ORAL
  Filled 2023-04-21: qty 1

## 2023-04-21 NOTE — Telephone Encounter (Signed)
 Patient Product/process development scientist completed.    The patient is insured through [employer].     Ran test claim for nimodipine 30 mg and Requires Prior Authorization   This test claim was processed through [employer]- copay amounts may vary at other pharmacies due to [employer], or as the patient moves through the different stages of their insurance plan.     Roland Earl, CPHT Pharmacy Technician III Certified Patient Advocate [facility] Patient Advocate Team Direct Number: [phone]  Fax: [phone]

## 2023-04-21 NOTE — Progress Notes (Signed)
 Physical Therapy Treatment Patient Details Name: Madeline Gomez MRN: [id number] DOB: 06/21/1980 Today's Date: 04/21/2023   History of Present Illness Pt is a 43 y.o. female presenting 3/10 with frontal headache, dizziness, and nausea. 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 Now s/p radiology with anaesthesia in which no aneurysm was found. PMH significant for mild persistent asthma, thyroid disease, anemia.    PT Comments  The pt was agreeable to session, continues to present with limited activity tolerance due to dizziness. She was able to ambulate ~200 ft without assistance or DME, but demos increased sway and drifting when dizziness hits. The pt was most limited when attempting stairs, but was able to complete 2 with use of handrail. VSS throughout. Recommendations remain appropriate.     If plan is discharge home, recommend the following: A little help with walking and/or transfers;Assistance with cooking/housework;Assist for transportation;Help with stairs or ramp for entrance   Can travel by private vehicle        Equipment Recommendations  Other (comment) (shower chair)    Recommendations for Other Services       Precautions / Restrictions Precautions Precautions: Fall Recall of Precautions/Restrictions: Intact Restrictions Weight Bearing Restrictions Per Provider Order: No     Mobility  Bed Mobility Overal bed mobility: Modified Independent             General bed mobility comments: increased time, HOB elevated    Transfers Overall transfer level: Needs assistance Equipment used: None Transfers: Sit to/from Stand Sit to Stand: Supervision           General transfer comment: Pt required increased time to perform STS. No physical assistance required. Supervision provided for safety    Ambulation/Gait Ambulation/Gait assistance: Contact guard assist, Supervision Gait Distance (Feet): 200 Feet Assistive device:  None Gait Pattern/deviations: Step-through pattern, Decreased stride length, Drifts right/left Gait velocity: slowed Gait velocity interpretation: <1.31 ft/sec, indicative of household ambulator   General Gait Details: pt reports dizziness when at stairs, leaning against wall.railing for support. no overt LOB and VSS.   Stairs Stairs: Yes Stairs assistance: Contact guard assist Stair Management: One rail Left, Alternating pattern, Forwards Number of Stairs: 2 General stair comments: limited due to pt dizziness in stairwell. VSS   Wheelchair Mobility     Tilt Bed    Modified Rankin (Stroke Patients Only) Modified Rankin (Stroke Patients Only) Pre-Morbid Rankin Score: No symptoms Modified Rankin: Moderately severe disability     Balance Overall balance assessment: Needs assistance Sitting-balance support: Feet unsupported, No upper extremity supported Sitting balance-Leahy Scale: Good Sitting balance - Comments: static sitting EOB   Standing balance support: No upper extremity supported, During functional activity Standing balance-Leahy Scale: Fair Standing balance comment: No UE support during functional mobility                            Communication Communication Communication: No apparent difficulties  Cognition Arousal: Alert Behavior During Therapy: WFL for tasks assessed/performed   PT - Cognitive impairments: No apparent impairments                         Following commands: Intact      Cueing Cueing Techniques: Verbal cues  Exercises      General Comments General comments (skin integrity, edema, etc.): VSS on RA      Pertinent Vitals/Pain Pain Assessment Pain Assessment: Faces Faces Pain Scale: Hurts little  more Pain Location: headache Pain Descriptors / Indicators: Headache Pain Intervention(s): Limited activity within patient's tolerance, Monitored during session, Repositioned    Home Living     Available Help at  Discharge: Family;Available PRN/intermittently Type of Home: House                      PT Goals (current goals can now be found in the care plan section) Acute Rehab PT Goals Patient Stated Goal: return home and to independence PT Goal Formulation: With patient Time For Goal Achievement: 05/03/23 Potential to Achieve Goals: Good Progress towards PT goals: Progressing toward goals    Frequency    Min 2X/week       AM-PAC PT "6 Clicks" Mobility   Outcome Measure  Help needed turning from your back to your side while in a flat bed without using bedrails?: A Little Help needed moving from lying on your back to sitting on the side of a flat bed without using bedrails?: A Little Help needed moving to and from a bed to a chair (including a wheelchair)?: A Little Help needed standing up from a chair using your arms (e.g., wheelchair or bedside chair)?: A Little Help needed to walk in hospital room?: A Little Help needed climbing 3-5 steps with a railing? : A Little 6 Click Score: 18    End of Session Equipment Utilized During Treatment: Gait belt Activity Tolerance: Other (comment) (dizziness) Patient left: in bed;with call bell/phone within reach;with bed alarm set Nurse Communication: Mobility status PT Visit Diagnosis: Unsteadiness on feet (R26.81);Other abnormalities of gait and mobility (R26.89);Pain     Time: 9147-8295 PT Time Calculation (min) (ACUTE ONLY): 27 min  Charges:    $Gait Training: 8-22 mins $Therapeutic Exercise: 8-22 mins PT General Charges $$ ACUTE PT VISIT: 1 Visit                     Vickki Muff, PT, DPT   Acute Rehabilitation Department Office [phone] Secure Chat Communication Preferred   Madeline Gomez 04/21/2023, 3:51 PM

## 2023-04-21 NOTE — Progress Notes (Signed)
  NEUROSURGERY PROGRESS NOTE   Pt seen and examined. No new complaints today.  EXAM: Temp:  [98 F (36.7 C)-99.2 F (37.3 C)] 98 F (36.7 C) (03/17 1600) Pulse Rate:  [64-107] 102 (03/17 1600) Resp:  [11-22] 21 (03/17 1600) BP: (94-136)/(60-97) 107/63 (03/17 1600) SpO2:  [93 %-98 %] 97 % (03/17 1600) Intake/Output      03/16 0701 03/17 0700 03/17 0701 03/18 0700   P.O.     I.V. (mL/kg)     Total Intake(mL/kg)     Net          Urine Occurrence 10 x 1 x    Awake, alert, oriented Speech fluent, appropriate CN grossly intact 5/5 BUE/BLE  LABS: Lab Results  Component Value Date   CREATININE 0.67 04/21/2023   BUN <5 (L) 04/21/2023   NA 137 04/21/2023   K 3.8 04/21/2023   CL 102 04/21/2023   CO2 25 04/21/2023   Lab Results  Component Value Date   WBC 8.7 04/21/2023   HGB 11.6 (L) 04/21/2023   HCT 35.5 (L) 04/21/2023   MCV 84.7 04/21/2023   PLT 304 04/21/2023      IMPRESSION: - 43 y.o. female SAH d#7, angio negative. Neurologically intact  PLAN: - Cont Nimotop - Mobilize ad lib - plan on angio tomorrow - NPO p MN - D/C home tomorrow if angio normal  Lisbeth Renshaw, MD [facility]

## 2023-04-21 NOTE — Plan of Care (Signed)
 Problem: Education: Goal: Knowledge of General Education information will improve Description: Including pain rating scale, medication(s)/side effects and non-pharmacologic comfort measures Outcome: Progressing   Problem: Health Behavior/Discharge Planning: Goal: Ability to manage health-related needs will improve Outcome: Progressing   Problem: Clinical Measurements: Goal: Ability to maintain clinical measurements within normal limits will improve Outcome: Progressing Goal: Will remain free from infection Outcome: Progressing Goal: Diagnostic test results will improve Outcome: Progressing Goal: Respiratory complications will improve Outcome: Progressing Goal: Cardiovascular complication will be avoided Outcome: Progressing   Problem: Activity: Goal: Risk for activity intolerance will decrease Outcome: Progressing   Problem: Nutrition: Goal: Adequate nutrition will be maintained Outcome: Progressing   Problem: Coping: Goal: Level of anxiety will decrease Outcome: Progressing   Problem: Elimination: Goal: Will not experience complications related to bowel motility Outcome: Progressing Goal: Will not experience complications related to urinary retention Outcome: Progressing   Problem: Pain Managment: Goal: General experience of comfort will improve and/or be controlled Outcome: Progressing   Problem: Safety: Goal: Ability to remain free from injury will improve Outcome: Progressing   Problem: Skin Integrity: Goal: Risk for impaired skin integrity will decrease Outcome: Progressing   Problem: Education: Goal: Knowledge of disease or condition will improve Outcome: Progressing Goal: Knowledge of secondary prevention will improve (MUST DOCUMENT ALL) Outcome: Progressing Goal: Knowledge of patient specific risk factors will improve (DELETE if not current risk factor) Outcome: Progressing   Problem: Intracerebral Hemorrhage Tissue Perfusion: Goal: Complications  of Intracerebral Hemorrhage will be minimized Outcome: Progressing   Problem: Coping: Goal: Will verbalize positive feelings about self Outcome: Progressing Goal: Will identify appropriate support needs Outcome: Progressing   Problem: Health Behavior/Discharge Planning: Goal: Ability to manage health-related needs will improve Outcome: Progressing Goal: Goals will be collaboratively established with patient/family Outcome: Progressing   Problem: Self-Care: Goal: Ability to participate in self-care as condition permits will improve Outcome: Progressing Goal: Verbalization of feelings and concerns over difficulty with self-care will improve Outcome: Progressing Goal: Ability to communicate needs accurately will improve Outcome: Progressing   Problem: Nutrition: Goal: Risk of aspiration will decrease Outcome: Progressing Goal: Dietary intake will improve Outcome: Progressing   Problem: Safety: Goal: Non-violent Restraint(s) Outcome: Progressing   Problem: Education: Goal: Understanding of CV disease, CV risk reduction, and recovery process will improve Outcome: Progressing Goal: Individualized Educational Video(s) Outcome: Progressing   Problem: Activity: Goal: Ability to return to baseline activity level will improve Outcome: Progressing   Problem: Cardiovascular: Goal: Ability to achieve and maintain adequate cardiovascular perfusion will improve Outcome: Progressing Goal: Vascular access site(s) Level 0-1 will be maintained Outcome: Progressing   Problem: Health Behavior/Discharge Planning: Goal: Ability to safely manage health-related needs after discharge will improve Outcome: Progressing   Problem: Education: Goal: Knowledge of disease or condition will improve Outcome: Progressing Goal: Knowledge of secondary prevention will improve (MUST DOCUMENT ALL) Outcome: Progressing Goal: Knowledge of patient specific risk factors will improve (DELETE if not  current risk factor) Outcome: Progressing   Problem: Spontaneous Subarachnoid Hemorrhage Tissue Perfusion: Goal: Complications of Spontaneous Subarachnoid Hemorrhage will be minimized Outcome: Progressing   Problem: Coping: Goal: Will verbalize positive feelings about self Outcome: Progressing Goal: Will identify appropriate support needs Outcome: Progressing   Problem: Health Behavior/Discharge Planning: Goal: Ability to manage health-related needs will improve Outcome: Progressing Goal: Goals will be collaboratively established with patient/family Outcome: Progressing   Problem: Self-Care: Goal: Ability to participate in self-care as condition permits will improve Outcome: Progressing Goal: Verbalization of feelings and concerns over difficulty with self-care  will improve Outcome: Progressing Goal: Ability to communicate needs accurately will improve Outcome: Progressing   Problem: Nutrition: Goal: Risk of aspiration will decrease Outcome: Progressing Goal: Dietary intake will improve Outcome: Progressing

## 2023-04-21 NOTE — Progress Notes (Addendum)
 VASCULAR LAB   Transcranial Doppler   Date POD PCO2 HCT BP   MCA ACA PCA OPHT SIPH VERT Basilar  04/19/23 CK       113/65 Right  Left   86  106   -31  -31   -24  64   17  18   *  *   -14  -17   -24       04/21/23 CK       112/82  Right  Left    90   97    -36   -21    59   -29    13   9     *   *    -20   -21    -30                 Right  Left                                                                 Right  Left                                                                 Right  Left                                                               Right  Left                                                               Right  Left                                                       *Unable to insonate [place]ard Ratio:  Right: 3.6 Left:2.9     MCA = Middle Cerebral Artery      OPHT = Opthalmic Artery     BASILAR = Basilar Artery   ACA = Anterior Cerebral Artery     SIPH = Carotid Siphon PCA = Posterior Cerebral Artery   VERT = Verterbral Artery                    Normal MCA = 62+\-12 ACA = 50+\-12 PCA = 42+\-23            Valor Quaintance, RVT 04/21/2023, 12:56 PM

## 2023-04-21 NOTE — Evaluation (Cosign Needed)
 Speech Language Pathology Evaluation Patient Details Name: Madeline Gomez MRN: [id number] DOB: 1980/10/23 Today's Date: 04/21/2023 Time: 7829-5621 SLP Time Calculation (min) (ACUTE ONLY): 28 min  Problem List:  Patient Active Problem List   Diagnosis Date Noted   Mild persistent asthma without complication 04/17/2023   Asthma, persistent controlled 04/16/2023   SAH (subarachnoid hemorrhage) (HCC) 04/14/2023   Allergic rhinitis 01/02/2015   Asthma with acute exacerbation 05/02/2014   Asthma, chronic 07/06/2012   Past Medical History:  Past Medical History:  Diagnosis Date   Anemia    Asthma    Broken bones    RIGHT FOOT    BV (bacterial vaginosis)    RECURRENT   H/O vitamin D deficiency    Thyroid disease    Past Surgical History:  Past Surgical History:  Procedure Laterality Date   CESAREAN SECTION     RADIOLOGY WITH ANESTHESIA N/A 04/15/2023   Procedure: RADIOLOGY WITH ANESTHESIA;  Surgeon: Lisbeth Renshaw, MD;  Location: [facility] OR;  Service: Radiology;  Laterality: N/A;   WISDOM TOOTH EXTRACTION     HPI:  Pt is a 43 y.o. female presenting 3/10 with frontal headache, dizziness, and nausea. 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 Now s/p radiology with anaesthesia in which no aneurysm was found. PMH significant for mild persistent asthma, thyroid disease, anemia.   Assessment / Plan / Recommendation Clinical Impression  Pt was seen for SLE to determine need for intervention. Pt presents with mild cognitive-communication deficits characterized by impairments in executive function and problem solving, with memory retrieval difficulties. Pt displayed language and motor speech WFL; pt spoke at a low intensity level.   Pt's focused attention during tasks appeared [facility]. Sustained attention appeared mildly impaired, especially during math and story recall tasks. Pt displayed memory retrieval deficit during delayed-recall task, where it took ~65-75  seconds for pt to recall all 5 objects. Pt demonstrated new information learning, but it took extra time to retrieve it. During clock drawing, pt made a note of the time given by SLP; however, the final clock displayed the wrong time, showing potential organization deficits. Pt demonstrated increased difficulty with math calculation and number attention tasks. She states that she typically uses paper to do math calculations.   Recommend f/u outpatient SLP services targeting higher level executive functions and problem solving for pt's ADLs. 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     SLP Assessment  SLP Recommendation/Assessment: All further Speech Lanaguage Pathology  needs can be addressed in the next venue of care SLP Visit Diagnosis: Cognitive communication deficit (R41.841)    Recommendations for follow up therapy are one component of a multi-disciplinary discharge planning process, led by the attending physician.  Recommendations may be updated based on patient status, additional functional criteria and insurance authorization.    Follow Up Recommendations  Outpatient SLP    Assistance Recommended at Discharge  PRN  Functional Status Assessment    Frequency and Duration           SLP Evaluation Cognition  Overall Cognitive Status: Impaired/Different from baseline Arousal/Alertness: Awake/alert Orientation Level: Oriented X4 Year: 2025 Day of Week: Correct Attention: Focused;Sustained Focused Attention: Appears intact Sustained Attention: Appears intact Memory: Impaired Memory Impairment: Retrieval deficit Awareness: Appears intact Problem Solving: Impaired Problem Solving Impairment: Verbal complex Executive Function: Organizing;Self Monitoring Organizing: Impaired Organizing Impairment: Functional basic Self Monitoring: Appears intact Safety/Judgment: Appears intact  Comprehension  Auditory Comprehension Overall Auditory Comprehension: Appears within functional limits for tasks assessed Visual Recognition/Discrimination Discrimination: Not tested Reading Comprehension Reading Status: Not tested    Expression Expression Primary Mode of Expression: Verbal Verbal Expression Overall Verbal Expression: Appears within functional limits for tasks assessed   Oral / Motor  Oral Motor/Sensory Function Overall Oral Motor/Sensory Function: Within functional limits Motor Speech Overall Motor Speech: Appears within functional limits for tasks assessed Phonation: Low vocal intensity            Rowe Robert 04/21/2023, 3:03 PM

## 2023-04-21 NOTE — Progress Notes (Signed)
   NAME:  Madeline Gomez, MRN:  [id number], DOB:  Dec 23, 1980, LOS: 7 ADMISSION DATE:  04/14/2023, CONSULTATION DATE:  3/12 REFERRING MD:  Dr. Wynetta Emery, CHIEF COMPLAINT:  SAH   History of Present Illness:  43 year old female with PMH as below, which is significant for asthma followed in our pulmonary clinic. She presented to [facility] ED 3/12 with complaints of sudden onset headache immediately following an orgasm. Headache associated with nausea. Felt different from her traditional migraine. CT head positive for subarachnoid hemorrhage. Neurosurgery was consulted. CT angio suggestive of acom aneurysm. Admitted to ICU for normal SAH monitoring. She was taken to cerebral angiogram, which was normal. PCCM consulted for ICU assistance.   Pertinent  Medical History   has a past medical history of Anemia, Asthma, Broken bones, BV (bacterial vaginosis), H/O vitamin D deficiency, and Thyroid disease.   Significant Hospital Events: Including procedures, antibiotic start and stop dates in addition to other pertinent events   3/10 admit with [facility] 3/11 cerebral angiogram normal, no aneurysm seen. No vasospasm seen.   Interim History / Subjective:   Received oxycodone for pain overnight, complains of itching. Headache is slightly improved  Objective   Blood pressure 105/75, pulse 75, temperature 98.8 F (37.1 C), temperature source Oral, resp. rate 20, height 5\' 3"  (1.6 m), weight 99.3 kg, SpO2 97%.       No intake or output data in the 24 hours ending 04/21/23 0847  Filed Weights   04/14/23 1948 04/15/23 0621  Weight: 104 kg 99.3 kg    Examination:  General: Adult female in NAD, Sleeping HENT: [place]/AT, PERRL, no JVD.  No neck stiffness Lungs: Clear, no accessory muscle use Cardiovascular: S1-S2 regular Abdomen: Soft, NT Extremities: No acute deformity or ROM limitation.  Neuro: Alert, oriented, non-focal.  Ancillary Tests;  TCD -3/15 no vasospasm  Assessment & Plan:   Subarachnoid  hemorrhage: CT angiogram concerning for a comm aneurysm, but diagnostic angiography was negative.  - Nimodipine  - tylenol for mild pain, Toradol for moderate pain, oxycodone for severe pain.  Atarax for itching.  Valium for muscle spasm.  - Keppra for sz ppx.  -Plan for angio and TCD's today  Mild persistent chronic asthma: has not been compliant with home medications, no signs of exacerbation.  - Breo - PRN albuterol neb -Unclear why dexamethasone added, will DC  Multinodular goiter - followed by endocrinology - TSH wnl   Best Practice (right click and "Reselect all SmartList Selections" daily)   Diet/type: Regular consistency (see orders) DVT prophylaxis SCD Pressure ulcer(s): pressure ulcer assessment deferred  GI prophylaxis: PPI Lines: N/A Foley:  N/A Code Status:  full code Last date of multidisciplinary goals of care discussion [ ]   Cyril Mourning MD. Tonny Bollman. [facility] Pager : [phone]  If no response to pager , please call [phone] until 7 pm After 7:00 pm call Elink  [phone]     04/21/2023, 8:47 AM

## 2023-04-21 NOTE — Evaluation (Signed)
 Occupational Therapy Evaluation Patient Details Name: ZOIEE WIMMER MRN: [id number] DOB: 1980/04/17 Today's Date: 04/21/2023   History of Present Illness   Pt is a 43 y.o. female presenting 3/10 with frontal headache, dizziness, and nausea. 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 Now s/p radiology with anaesthesia in which no aneurysm was found. PMH significant for mild persistent asthma, thyroid disease, anemia.     Clinical Impressions Pt evaluated s/p the admission list above. At baseline, pt lives at home with significant other and children, works from home, drives, and completes all ADLs/IADLs independently. Upon evaluation, pt required up to supervision for all aspects of functional mobility without use of DME with reports of dizziness throughout. Pt BP monitored and was stable throughout. Based on evaluation, pt will require up to supervision for self-care tasks. Due to pt reports of having recent difficulty remembering certain aspects of the last few days and decreased knowledge of compensatory strategies pt will benefit from continued OT services. OT to continue following pt acutely with discharge recommendations of OP OT to improve higher level executive functions and to ensure safe discharge to home environment.      If plan is discharge home, recommend the following:   A little help with walking and/or transfers;A little help with bathing/dressing/bathroom;Assistance with cooking/housework;Assist for transportation     Functional Status Assessment   Patient has had a recent decline in their functional status and demonstrates the ability to make significant improvements in function in a reasonable and predictable amount of time.     Equipment Recommendations   Tub/shower seat     Recommendations for Other Services         Precautions/Restrictions   Precautions Precautions: Fall Recall of Precautions/Restrictions:  Intact Restrictions Weight Bearing Restrictions Per Provider Order: No     Mobility Bed Mobility Overal bed mobility: Modified Independent             General bed mobility comments: increased time, HOB elevated    Transfers Overall transfer level: Needs assistance Equipment used: None Transfers: Sit to/from Stand Sit to Stand: Supervision           General transfer comment: Pt required increased time to perform STS. No physical assistance required. Supervision provided for safety      Balance Overall balance assessment: Needs assistance Sitting-balance support: Feet unsupported, No upper extremity supported Sitting balance-Leahy Scale: Good Sitting balance - Comments: static sitting EOB   Standing balance support: No upper extremity supported, During functional activity Standing balance-Leahy Scale: Fair Standing balance comment: No UE support during functional mobility                           ADL either performed or assessed with clinical judgement   ADL Overall ADL's : Needs assistance/impaired Eating/Feeding: Independent;Sitting   Grooming: Supervision/safety;Standing   Upper Body Bathing: Supervision/ safety;Sitting   Lower Body Bathing: Supervison/ safety;Sit to/from stand;Sitting/lateral leans   Upper Body Dressing : Supervision/safety;Sitting   Lower Body Dressing: Supervision/safety;Sit to/from stand   Toilet Transfer: Supervision/safety;Ambulation;Regular Toilet   Toileting- Architect and Hygiene: Supervision/safety;Sit to/from stand;Sitting/lateral lean       Functional mobility during ADLs: Supervision/safety General ADL Comments: Supervision provided for safety. Pt reported increased dizziness with transitions and throughout session. Pt BP read 139/93 (107) seated EOB at the beginning of session and 125/96 (105) at end of session     Vision Baseline Vision/History: 0 No visual deficits Ability  to See in Adequate  Light: 0 Adequate Patient Visual Report: No change from baseline Vision Assessment?: No apparent visual deficits     Perception Perception: Within Functional Limits       Praxis Praxis: Not tested       Pertinent Vitals/Pain Pain Assessment Pain Assessment: Faces Faces Pain Scale: Hurts little more Pain Location: headache Pain Descriptors / Indicators: Headache Pain Intervention(s): Monitored during session, Patient requesting pain meds-RN notified     Extremity/Trunk Assessment Upper Extremity Assessment Upper Extremity Assessment: Overall WFL for tasks assessed   Lower Extremity Assessment Lower Extremity Assessment: Defer to PT evaluation   Cervical / Trunk Assessment Cervical / Trunk Assessment: Normal   Communication Communication Communication: No apparent difficulties   Cognition Arousal: Alert Behavior During Therapy: WFL for tasks assessed/performed Cognition: No apparent impairments             OT - Cognition Comments: Pt followed all verbal commands, able to express/wants needs, alert and appropriate throughout                 Following commands: Intact       Cueing  General Comments   Cueing Techniques: Verbal cues  VSS on RA; BP was 125/96 (105) at end of session   Exercises     Shoulder Instructions      Home Living Family/patient expects to be discharged to:: Private residence Living Arrangements: Children;Spouse/significant other Available Help at Discharge: Family;Available PRN/intermittently Type of Home: House Home Access: Stairs to enter [employer] of Steps: 3 Entrance Stairs-Rails: None Home Layout: Two level;1/2 bath on main level;Bed/bath upstairs Alternate Level Stairs-Number of Steps: flight Alternate Level Stairs-Rails: None Bathroom Shower/Tub: Chief Strategy Officer: Standard     Home Equipment: Hand held shower head   Additional Comments: pt lives with 16, 17, and 48 yo children       Prior Functioning/Environment Prior Level of Function : Independent/Modified Independent;Driving;Working/employed             Mobility Comments: independent ADLs Comments: independent, 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    OT Problem List: Decreased strength;Decreased activity tolerance;Impaired balance (sitting and/or standing)   OT Treatment/Interventions: Self-care/ADL training;Therapeutic exercise;Energy conservation;Therapeutic activities;Patient/family education;Balance training      OT Goals(Current goals can be found in the care plan section)   Acute Rehab OT Goals Patient Stated Goal: none stated OT Goal Formulation: With patient Time For Goal Achievement: 05/05/23 Potential to Achieve Goals: Good ADL Goals Pt Will Perform Upper Body Dressing: Independently Pt Will Perform Lower Body Dressing: Independently Pt Will Transfer to Toilet: Independently Pt Will Perform Toileting - Clothing Manipulation and hygiene: Independently Additional ADL Goal #1: Pt will demonstrate improved activity tolerance by completintg 10 minutes standing functional activity without requiring a rest break   OT Frequency:  Min 2X/week    Co-evaluation              AM-PAC OT "6 Clicks" Daily Activity     Outcome Measure Help from another person eating meals?: None Help from another person taking care of personal grooming?: A Little Help from another person toileting, which includes using toliet, bedpan, or urinal?: A Little Help from another person bathing (including washing, rinsing, drying)?: A Little Help from another person to put on and taking off regular upper body clothing?: A Little Help from another person to put on and taking off regular lower body clothing?: A Little 6 Click Score: 19   End of Session Equipment Utilized  During Treatment: Gait belt Nurse Communication: Mobility status  Activity Tolerance: Patient tolerated treatment well Patient left: in bed;with  call bell/phone within reach  OT Visit Diagnosis: Unsteadiness on feet (R26.81)                Time: 9562-1308 OT Time Calculation (min): 29 min Charges:  OT General Charges $OT Visit: 1 Visit OT Evaluation $OT Eval Moderate Complexity: 1 Mod OT Treatments $Therapeutic Activity: 8-22 mins  Kevan Ny, MOTS  Janeli Lewison 04/21/2023, 10:26 AM

## 2023-04-22 ENCOUNTER — Other Ambulatory Visit (HOSPITAL_COMMUNITY): Payer: Self-pay

## 2023-04-22 ENCOUNTER — Inpatient Hospital Stay (HOSPITAL_COMMUNITY)

## 2023-04-22 ENCOUNTER — Other Ambulatory Visit (HOSPITAL_COMMUNITY)

## 2023-04-22 ENCOUNTER — Telehealth (HOSPITAL_COMMUNITY): Payer: Self-pay | Admitting: Pharmacy Technician

## 2023-04-22 DIAGNOSIS — I609 Nontraumatic subarachnoid hemorrhage, unspecified: Secondary | ICD-10-CM | POA: Diagnosis not present

## 2023-04-22 HISTORY — PX: IR ANGIO INTRA EXTRACRAN SEL INTERNAL CAROTID BILAT MOD SED: IMG5363

## 2023-04-22 HISTORY — PX: IR ANGIO VERTEBRAL SEL VERTEBRAL BILAT MOD SED: IMG5369

## 2023-04-22 MED ORDER — OXYCODONE HCL 5 MG PO TABS: 5.0000 mg | ORAL_TABLET | Freq: Four times a day (QID) | ORAL | 0 refills | Status: DC | PRN

## 2023-04-22 MED ORDER — FENTANYL CITRATE (PF) 100 MCG/2ML IJ SOLN
INTRAMUSCULAR | Status: AC
  Filled 2023-04-22: qty 2

## 2023-04-22 MED ORDER — FENTANYL CITRATE (PF) 100 MCG/2ML IJ SOLN
INTRAMUSCULAR | Status: AC | PRN
  Administered 2023-04-22: 25 ug via INTRAVENOUS

## 2023-04-22 MED ORDER — NIMODIPINE 30 MG PO CAPS
60.0000 mg | ORAL_CAPSULE | ORAL | 0 refills | Status: AC
  Filled 2023-04-22 – 2023-04-23 (×2): qty 156, 13d supply, fill #0

## 2023-04-22 MED ORDER — IOHEXOL 300 MG/ML  SOLN
100.0000 mL | Freq: Once | INTRAMUSCULAR | Status: AC | PRN
  Administered 2023-04-22: 46 mL via INTRA_ARTERIAL

## 2023-04-22 MED ORDER — OXYCODONE HCL 5 MG PO TABS
5.0000 mg | ORAL_TABLET | Freq: Four times a day (QID) | ORAL | 0 refills | Status: DC | PRN
  Filled 2023-04-22: qty 28, 7d supply, fill #0

## 2023-04-22 MED ORDER — LIDOCAINE HCL 1 % IJ SOLN
20.0000 mL | Freq: Once | INTRAMUSCULAR | Status: AC
  Administered 2023-04-22: 10 mL via INTRADERMAL
  Filled 2023-04-22: qty 20

## 2023-04-22 MED ORDER — MIDAZOLAM HCL 2 MG/2ML IJ SOLN
INTRAMUSCULAR | Status: AC | PRN
Start: 2023-04-22 — End: 2023-04-22
  Administered 2023-04-22: 1 mg via INTRAVENOUS

## 2023-04-22 MED ORDER — LIDOCAINE HCL 1 % IJ SOLN
INTRAMUSCULAR | Status: AC
  Filled 2023-04-22: qty 20

## 2023-04-22 MED ORDER — MIDAZOLAM HCL 2 MG/2ML IJ SOLN
INTRAMUSCULAR | Status: AC
  Filled 2023-04-22: qty 2

## 2023-04-22 MED ORDER — HEPARIN SODIUM (PORCINE) 1000 UNIT/ML IJ SOLN
INTRAMUSCULAR | Status: AC
  Filled 2023-04-22: qty 10

## 2023-04-22 MED ORDER — OXYCODONE HCL 5 MG PO TABS
5.0000 mg | ORAL_TABLET | Freq: Four times a day (QID) | ORAL | 0 refills | Status: DC | PRN
Start: 2023-04-22 — End: 2023-04-22

## 2023-04-22 NOTE — Sedation Documentation (Signed)
 Bedside report/handoff given to [place], RN in 4N; R femoral site remained level 0; assessed with [facility]; dressing (gauze and tegedrem) clean, dry, intact; pulses remain unchanged

## 2023-04-22 NOTE — Telephone Encounter (Signed)
 Pharmacy Patient Advocate Encounter   Received notification that prior authorization for niMODipine 30MG  capsules is required/requested.   Insurance verification completed.   The patient is insured through [facility] [place] .   Per test claim: PA required; PA submitted to above mentioned insurance via CoverMyMeds Key/confirmation #/[id number] Status is pending

## 2023-04-22 NOTE — Telephone Encounter (Signed)
 Pharmacy Patient Advocate Encounter  Received notification from [facility] Medicaid that Prior Authorization for niMODipine 30MG  capsules  has been APPROVED from 04/22/2023 to 04/21/2024. Ran test claim, Copay is $4.00. This test claim was processed through [facility]- copay amounts may vary at other pharmacies due to pharmacy/plan contracts, or as the patient moves through the different stages of their insurance plan.   PA #/Case ID/Reference #: [id number]

## 2023-04-22 NOTE — Progress Notes (Signed)
  NEUROSURGERY PROGRESS NOTE   Pt seen and examined. No new complaints today.  EXAM: Temp:  [97.8 F (36.6 C)-99 F (37.2 C)] 99 F (37.2 C) (03/18 0400) Pulse Rate:  [68-102] 68 (03/18 0600) Resp:  [11-21] 13 (03/18 0600) BP: (89-127)/(51-82) 127/70 (03/18 0600) SpO2:  [95 %-98 %] 98 % (03/18 0600) Intake/Output      03/17 0701 03/18 0700 03/18 0701 03/19 0700   P.O. 390    Total Intake(mL/kg) 390 (3.9)    Net +390         Urine Occurrence 8 x     Awake, alert, oriented Speech fluent, appropriate CN grossly intact 5/5 BUE/BLE  LABS: Lab Results  Component Value Date   CREATININE 0.67 04/21/2023   BUN <5 (L) 04/21/2023   NA 137 04/21/2023   K 3.8 04/21/2023   CL 102 04/21/2023   CO2 25 04/21/2023   Lab Results  Component Value Date   WBC 8.7 04/21/2023   HGB 11.6 (L) 04/21/2023   HCT 35.5 (L) 04/21/2023   MCV 84.7 04/21/2023   PLT 304 04/21/2023      IMPRESSION: - 43 y.o. female SAH d#8, angio negative. Neurologically intact  PLAN: - Plan on angio today - If normal, can d/c home to complete 21d Nimotop   Lisbeth Renshaw, MD [facility]

## 2023-04-22 NOTE — Discharge Summary (Signed)
 Physician Discharge Summary  Patient ID: Madeline Gomez MRN: [id number] DOB/AGE: 1980/07/11 43 y.o.  Admit date: 04/14/2023 Discharge date: 04/22/2023  Admission Diagnoses:  Subarachnoid Hemorrhage  Discharge Diagnoses:  Same Principal Problem:   SAH (subarachnoid hemorrhage) (HCC) Active Problems:   Asthma, persistent controlled   Mild persistent asthma without complication   Discharged Condition: Stable  Hospital Course:  Madeline Gomez is a 43 y.o. female admitted to the hospital with sudden onset of severe headache but otherwise neurologically intact.  Initial CT angiogram suggested possible anterior communicating artery aneurysm however diagnostic cerebral angiogram was negative.  She was monitored in the intensive care unit over the course of 8 days without change in neurologic condition.  She underwent repeat diagnostic cerebral angiogram again negative for intracranial aneurysm or other arteriovenous malformation.  She was therefore discharged home in stable condition.  Treatments: Diagnostic cerebral angiogram x 2  Discharge Exam: Blood pressure 124/76, pulse 80, temperature 98.9 F (37.2 C), temperature source Axillary, resp. rate 15, height 5\' 3"  (1.6 m), weight 99.3 kg, SpO2 96%. Awake, alert, oriented Speech fluent, appropriate CN grossly intact 5/5 BUE/BLE Wound c/d/i  Disposition: Discharge disposition: 01-Home or Self Care       Discharge Instructions     Call MD for:  redness, tenderness, or signs of infection (pain, swelling, redness, odor or green/yellow discharge around incision site)   Complete by: As directed    Call MD for:  temperature >100.4   Complete by: As directed    Diet - low sodium heart healthy   Complete by: As directed    Discharge instructions   Complete by: As directed    Walk at home as much as possible, at least 4 times / day   Increase activity slowly   Complete by: As directed    Lifting restrictions   Complete by: As  directed    No lifting > 10 lbs   May shower / Bathe   Complete by: As directed    48 hours after surgery   May walk up steps   Complete by: As directed    No dressing needed   Complete by: As directed    Other Restrictions   Complete by: As directed    No bending/twisting at waist      Allergies as of 04/22/2023       Reactions   Other Hives, Other (See Comments)   PT IS ALLERGIC TO GRASS, CATS, TOMATOES, PINEAPPLES, POLLEN   Percocet [oxycodone-acetaminophen] Itching   Prednisone Itching   Rapid heart rate, made me feel bad        Medication List     TAKE these medications    albuterol 108 (90 Base) MCG/ACT inhaler Commonly known as: VENTOLIN HFA Inhale 2 puffs into the lungs every 4 (four) hours as needed for wheezing.   cetirizine 10 MG tablet Commonly known as: ZYRTEC Take 10 mg by mouth daily as needed for rhinitis or allergies.   Cholecalciferol 1.25 MG (50000 UT) capsule Take 50,000 Units by mouth once a week. Thursday   metroNIDAZOLE 0.75 % cream Commonly known as: METROCREAM Apply 1 Application topically 2 (two) times daily.   Mirena (52 MG) 20 MCG/DAY Iud Generic drug: levonorgestrel 1 each by Intrauterine route once.   niMODipine 30 MG capsule Commonly known as: NIMOTOP Take 2 capsules (60 mg total) by mouth every 4 (four) hours for 13 days.   oxyCODONE 5 MG immediate release tablet Commonly known as: Oxy IR/ROXICODONE Take  1 tablet (5 mg total) by mouth every 6 (six) hours as needed for up to 7 days for severe pain (pain score 7-10).               Discharge Care Instructions  (From admission, onward)           Start     Ordered   04/22/23 0000  No dressing needed        04/22/23 1832            Follow-up Information     Lisbeth Renshaw, MD Follow up in 3 week(s).   Specialty: Neurosurgery Contact information: [address] [place] [address] [phone]                  Signed: Jackelyn Hoehn 04/22/2023, 6:33 PM

## 2023-04-22 NOTE — Progress Notes (Signed)
   NAME:  Madeline Gomez, MRN:  [id number], DOB:  1980-09-06, LOS: 8 ADMISSION DATE:  04/14/2023, CONSULTATION DATE:  3/12 REFERRING MD:  Dr. Wynetta Emery, CHIEF COMPLAINT:  SAH   History of Present Illness:  43 year old female with PMH as below, which is significant for asthma followed in our pulmonary clinic. She presented to [facility] ED 3/12 with complaints of sudden onset headache immediately following an orgasm. Headache associated with nausea. Felt different from her traditional migraine. CT head positive for subarachnoid hemorrhage. Neurosurgery was consulted. CT angio suggestive of acom aneurysm. Admitted to ICU for normal SAH monitoring. She was taken to cerebral angiogram, which was normal. PCCM consulted for ICU assistance.   Pertinent  Medical History   has a past medical history of Anemia, Asthma, Broken bones, BV (bacterial vaginosis), H/O vitamin D deficiency, and Thyroid disease.   Significant Hospital Events: Including procedures, antibiotic start and stop dates in addition to other pertinent events   3/10 admit with [facility] 3/11 cerebral angiogram normal, no aneurysm seen. No vasospasm seen.  3/15 TCD no spasm  3/17 TCD no spasm 3/18 for repeat cerebral angio   Interim History / Subjective:  NAEO HA this morning  NPO for angio   No labs 3/18  Objective   Blood pressure 127/70, pulse 68, temperature 99 F (37.2 C), temperature source Oral, resp. rate 13, height 5\' 3"  (1.6 m), weight 99.3 kg, SpO2 98%.        Intake/Output Summary (Last 24 hours) at 04/22/2023 0811 Last data filed at 04/21/2023 1600 Gross per 24 hour  Intake 290 ml  Output --  Net 290 ml    Filed Weights   04/14/23 1948 04/15/23 0621  Weight: 104 kg 99.3 kg    Examination:  General:  wdwn middle aged F NAD  HENT: NCAT pink mm anicteric sclera  Lungs: CTAb on RA  Cardiovascular: rrr s1s2 cap refill brisk  Abdomen: soft  Extremities: no acute joint deformity. Artificial nails  Neuro:  awakens  easily, AAOx4 following commands PERRLA 3mm no focal def   Ancillary Tests;  TCD -3/15 no vasospasm  Assessment & Plan:   SAH HA due to above  -dx angio neg 3/11  P -for angio 3/18, NPO  -nimotop  -on keppra for sz ppx  -if angio neg, possible dc home for 21 d nimotop -PRN analgesia for HA  -PT/OT   Mild persistent asthma  -breo, PRN albuterol   Goiter  -TSH WNL -follows w endo    Best Practice (right click and "Reselect all SmartList Selections" daily)   Diet/type: Regular consistency (see orders) -- 3/18 NPO  DVT prophylaxis SCD Pressure ulcer(s): pressure ulcer assessment deferred  GI prophylaxis: PPI Lines: N/A Foley:  N/A Code Status:  full code Last date of multidisciplinary goals of care discussion [ per primary  ]    CCT n/a    [name] MSN, AGACNP-BC [facility] Amion for pager  04/22/2023, 8:11 AM

## 2023-04-23 ENCOUNTER — Other Ambulatory Visit (HOSPITAL_COMMUNITY): Payer: Self-pay

## 2023-04-23 ENCOUNTER — Inpatient Hospital Stay (HOSPITAL_COMMUNITY)

## 2023-04-23 DIAGNOSIS — I609 Nontraumatic subarachnoid hemorrhage, unspecified: Secondary | ICD-10-CM | POA: Diagnosis not present

## 2023-04-23 MED ORDER — OXYCODONE HCL 5 MG PO TABS: 5.0000 mg | ORAL_TABLET | Freq: Four times a day (QID) | ORAL | 0 refills | Status: AC | PRN

## 2023-04-23 MED ORDER — OXYCODONE HCL 5 MG PO TABS: 5.0000 mg | ORAL_TABLET | Freq: Four times a day (QID) | ORAL | 0 refills | Status: DC | PRN

## 2023-04-23 NOTE — Progress Notes (Signed)
 Occupational Therapy Treatment Patient Details Name: Madeline Gomez MRN: [id number] DOB: Sep 06, 1980 Today's Date: 04/23/2023   History of present illness Pt presenting 3/10 with frontal headache, dizziness, and nausea. 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 Now s/p radiology with anaesthesia in which no aneurysm was found 3/11. Repeat diagnostic cerebral angiogram again negative 3/18. PMH significant for mild persistent asthma, thyroid disease, anemia.   OT comments  Pt is making continued progress towards acute OT goals. Pt continues to be limited by cognition and decreased activity tolerance. Pt required supervision, increased time, and verbal encouragement for continuation to complete bed mobility and functional mobility tasks without use of AD. Pt completed toileting for bladder management with supervision without reports of dizziness with head turns on this date. Cognition assessed and pt scored a 4 on SBT with verbal cueing to accurately recall name and number on address recall section. Due to mild impairments in memory and slowed pace problem solving with executive functioning task, pt will benefit from OP OT to address higher level cognition and executive functioning to maximize pt independence in functional tasks and ensure safe discharge to home environment.       If plan is discharge home, recommend the following:  Direct supervision/assist for medications management;Direct supervision/assist for financial management;Assist for transportation;Help with stairs or ramp for entrance;Assistance with cooking/housework   Equipment Recommendations  Tub/shower seat    Recommendations for Other Services      Precautions / Restrictions Precautions Precautions: Fall Recall of Precautions/Restrictions: Intact Restrictions Weight Bearing Restrictions Per Provider Order: No       Mobility Bed Mobility Overal bed mobility: Modified Independent              General bed mobility comments: increased time, HOB elevated    Transfers Overall transfer level: Needs assistance Equipment used: None Transfers: Sit to/from Stand Sit to Stand: Supervision           General transfer comment: Pt completed STS with increased time and supervision provided for safety     Balance Overall balance assessment: Needs assistance Sitting-balance support: Feet unsupported, No upper extremity supported Sitting balance-Leahy Scale: Good Sitting balance - Comments: static sitting EOB   Standing balance support: No upper extremity supported, During functional activity Standing balance-Leahy Scale: Fair Standing balance comment: No UE support during functional mobility                           ADL either performed or assessed with clinical judgement   ADL Overall ADL's : Needs assistance/impaired                         Toilet Transfer: Supervision/safety;Ambulation;Regular Toilet   Toileting- Architect and Hygiene: Supervision/safety;Sit to/from stand;Sitting/lateral lean       Functional mobility during ADLs: Supervision/safety General ADL Comments: Pt provided with supervision to safely perform functional mobility in room without use of AD. Pt required increased time to perform bed mobility tasks. Pt ported having a headache and nausea during session    Extremity/Trunk Assessment Upper Extremity Assessment Upper Extremity Assessment: Overall WFL for tasks assessed   Lower Extremity Assessment Lower Extremity Assessment: Defer to PT evaluation        Vision   Vision Assessment?: No apparent visual deficits   Perception Perception Perception: Within Functional Limits   Praxis Praxis Praxis: Not tested   Communication Communication Communication: No apparent difficulties  Cognition Arousal: Alert Behavior During Therapy: Flat affect Cognition: Cognition impaired       Memory impairment  (select all impairments): Short-term memory, Working memory     OT - Cognition Comments: Pt reports difficulty remembering some moments of the last few days. Pt scored a 4 on SBT requiring cueing and options to recall name and address number. Pt with good problem solving during SBT with increased time. Pt required cueing and encouragement throughout session                 Following commands: Intact        Cueing   Cueing Techniques: Verbal cues  Exercises      Shoulder Instructions       General Comments VSS on RA    Pertinent Vitals/ Pain       Pain Assessment Pain Assessment: Faces Faces Pain Scale: Hurts a little bit Pain Location: headache Pain Descriptors / Indicators: Headache Pain Intervention(s): Limited activity within patient's tolerance, Monitored during session  Home Living                                          Prior Functioning/Environment              Frequency  Min 2X/week        Progress Toward Goals  OT Goals(current goals can now be found in the care plan section)  Progress towards OT goals: Progressing toward goals  Acute Rehab OT Goals Patient Stated Goal: use restroom OT Goal Formulation: With patient Time For Goal Achievement: 05/05/23 Potential to Achieve Goals: Good ADL Goals Pt Will Perform Upper Body Dressing: Independently Pt Will Perform Lower Body Dressing: Independently Pt Will Transfer to Toilet: Independently Pt Will Perform Toileting - Clothing Manipulation and hygiene: Independently Additional ADL Goal #1: Pt will demonstrate improved activity tolerance by completintg 10 minutes standing functional activity without requiring a rest break  Plan      Co-evaluation                 AM-PAC OT "6 Clicks" Daily Activity     Outcome Measure   Help from another person eating meals?: None Help from another person taking care of personal grooming?: A Little Help from another person  toileting, which includes using toliet, bedpan, or urinal?: A Little Help from another person bathing (including washing, rinsing, drying)?: A Little Help from another person to put on and taking off regular upper body clothing?: A Little Help from another person to put on and taking off regular lower body clothing?: A Little 6 Click Score: 19    End of Session Equipment Utilized During Treatment: Gait belt  OT Visit Diagnosis: Unsteadiness on feet (R26.81);Other abnormalities of gait and mobility (R26.89);Muscle weakness (generalized) (M62.81)   Activity Tolerance Patient tolerated treatment well   Patient Left in bed;with call bell/phone within reach   Nurse Communication Mobility status        Time: 7829-5621 OT Time Calculation (min): 23 min  Charges: OT General Charges $OT Visit: 1 Visit OT Treatments $Self Care/Home Management : 8-22 mins $Cognitive Funtion inital: Initial 15 mins Lynnda Shields 04/23/2023, 9:14 AM

## 2023-04-23 NOTE — Progress Notes (Signed)
  Date of Service: 04/23/2023 10:12 AM    VASCULAR LAB     Transcranial Doppler   Date POD PCO2 HCT BP   MCA ACA PCA OPHT SIPH VERT Basilar  04/19/23 CK       113/65 Right  Left   86  106   -31  -31   -24  64   17  18   *  *   -14  -17   -24       04/21/23 CK       112/82  Right  Left    90   97    -36   -21    59   -29    13   9     *   *    -20   -21    -30        3/19 SW         Right  Left    93   85    -34   -25    58   31    14   12     *   *    -30   -28    -30                   Right  Left                                                                 Right  Left                                                               Right  Left                                                               Right  Left                                                       *Unable to insonate Lindegaard Ratio:  Right: 2.5 Left: 2.0     MCA = Middle Cerebral Artery      OPHT = Opthalmic Artery     BASILAR = Basilar Artery   ACA = Anterior Cerebral Artery     SIPH = Carotid Siphon PCA = Posterior Cerebral Artery   VERT = Verterbral Artery                    Normal MCA = 62+\-12 ACA = 50+\-12 PCA = 42+\-23               Toshiye Kever, RVT 04/23/2023, 10:12 PM

## 2023-04-23 NOTE — Progress Notes (Signed)
 Physical Therapy Treatment and Discharge Patient Details Name: Madeline Gomez MRN: [id number] DOB: [birth year] Today's Date: 04/23/2023   History of Present Illness Pt presenting 3/10 with frontal headache, dizziness, and nausea. 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 Now s/p radiology with anaesthesia in which no aneurysm was found 3/11. Repeat diagnostic cerebral angiogram again negative 3/18. PMH significant for mild persistent asthma, thyroid disease, anemia.    PT Comments  Patient agreeable to participate with therapy, reports fatigue only. The patient is progressing well towards PT goals. Patient is ModI for bed mobility, transitioning to EOB with increased time. Under supervision for safety, the patient powers herself up from a seated position without any external assistance. Patient requires CGA-supervision when ambulating 156ft within the unit without an AD and when practicing stair negotiation. Cues provided to ensure safety and proper technique when ascending an descending steps. Patient would benefit from OPPT to maximize her functional level of independence and to improve functional endurance for ADLs/IADLs.    If plan is discharge home, recommend the following: A little help with walking and/or transfers;Assistance with cooking/housework;Assist for transportation;Help with stairs or ramp for entrance   Can travel by private vehicle        Equipment Recommendations  Other (comment)    Recommendations for Other Services       Precautions / Restrictions Precautions Precautions: Fall Recall of Precautions/Restrictions: Intact Restrictions Weight Bearing Restrictions Per Provider Order: No     Mobility  Bed Mobility Overal bed mobility: Modified Independent             General bed mobility comments: increased time, HOB elevated    Transfers Overall transfer level: Needs assistance Equipment used: None Transfers: Sit to/from  Stand Sit to Stand: Supervision           General transfer comment: Pt completed STS with increased time and supervision provided for safety    Ambulation/Gait Ambulation/Gait assistance: Contact guard assist, Supervision Gait Distance (Feet): 175 Feet Assistive device: None Gait Pattern/deviations: Step-through pattern, Decreased stride length, Drifts right/left Gait velocity: Decreased     General Gait Details: No drifting during this session, or reports of dizziness/lightheadedness   Stairs Stairs: Yes Stairs assistance: Contact guard assist, Supervision Stair Management: No rails, Step to pattern, Sideways Number of Stairs: 14 General stair comments: Expresses some concern and nervousness when descending stairs, however pt tolerated stair negotiation well with cues for safety and technique (uses wall for support with trunk rotated towards wall at a 45-degree angle, both hands on wall)   Wheelchair Mobility     Tilt Bed    Modified Rankin (Stroke Patients Only)       Balance Overall balance assessment: Needs assistance Sitting-balance support: Feet unsupported, No upper extremity supported Sitting balance-Leahy Scale: Good Sitting balance - Comments: static sitting EOB   Standing balance support: No upper extremity supported, During functional activity Standing balance-Leahy Scale: Fair Standing balance comment: No UE support during functional mobility                            Communication Communication Communication: No apparent difficulties  Cognition Arousal: Alert Behavior During Therapy: WFL for tasks assessed/performed                             Following commands: Intact      Cueing Cueing Techniques: Verbal cues, Gestural cues  Exercises      General Comments General comments (skin integrity, edema, etc.): VSS on RA      Pertinent Vitals/Pain Pain Assessment Pain Assessment: Faces Faces Pain Scale: Hurts a  little bit Pain Location: headache Pain Descriptors / Indicators: Headache Pain Intervention(s): Monitored during session    Home Living                          Prior Function            PT Goals (current goals can now be found in the care plan section) Acute Rehab PT Goals Patient Stated Goal: return home and to independence PT Goal Formulation: With patient Time For Goal Achievement: 05/03/23 Potential to Achieve Goals: Good Progress towards PT goals: Progressing toward goals    Frequency    Min 2X/week      PT Plan      Co-evaluation              AM-PAC PT "6 Clicks" Mobility   Outcome Measure  Help needed turning from your back to your side while in a flat bed without using bedrails?: None Help needed moving from lying on your back to sitting on the side of a flat bed without using bedrails?: None Help needed moving to and from a bed to a chair (including a wheelchair)?: A Little Help needed standing up from a chair using your arms (e.g., wheelchair or bedside chair)?: A Little Help needed to walk in hospital room?: A Little Help needed climbing 3-5 steps with a railing? : A Little 6 Click Score: 20    End of Session Equipment Utilized During Treatment: Gait belt Activity Tolerance: Patient tolerated treatment well Patient left: in bed;with call bell/phone within reach;with nursing/sitter in room Nurse Communication: Mobility status PT Visit Diagnosis: Unsteadiness on feet (R26.81);Other abnormalities of gait and mobility (R26.89);Pain     Time: 1610-9604 PT Time Calculation (min) (ACUTE ONLY): 27 min  Charges:    $Therapeutic Activity: 23-37 mins PT General Charges $$ ACUTE PT VISIT: 1 Visit                     Doreen Beam, SPT   Madeline Gomez 04/23/2023, 10:33 AM

## 2023-04-23 NOTE — TOC Transition Note (Addendum)
 Transition of Care [facility]) - Discharge Note   Patient Details  Name: Madeline Gomez MRN: [id number] Date of Birth: 04-26-1980  [facility] CM/SW Contact:  Tom-Johnson, Hershal Coria, RN Phone Number: 04/23/2023, 9:28 AM   Clinical Narrative:     Patient is scheduled for discharge today.  Readmission Risk Assessment done. Outpatient OT referral, hospital f/u and discharge instructions on AVS. Prescriptions sent to [facility] pharmacy and patient will receive meds prior discharge. Shower Chair ordered from [employer] to deliver to patient at bedside prior to discharge.  Friend to transport at discharge.  No further TOC needs noted.       Final next level of care: Home/Self Care Barriers to Discharge: Barriers Resolved   Patient Goals and CMS Choice Patient states their goals for this hospitalization and ongoing recovery are:: To return home CMS Medicare.gov Compare Post Acute Care list provided to:: Patient Choice offered to / list presented to : Patient      Discharge Placement                Patient to be transferred to facility by: Friend      Discharge Plan and Services Additional resources added to the After Visit Summary for                  DME Arranged: Shower stool DME Agency: [employer] Date DME Agency Contacted: 04/23/23 Time DME Agency Contacted: [phone] Representative spoke with at DME Agency: Vaughan Basta HH Arranged: NA HH Agency: NA        Social Drivers of Health (SDOH) Interventions SDOH Screenings   Food Insecurity: No Food Insecurity (04/16/2023)  Housing: High Risk (04/16/2023)  Transportation Needs: No Transportation Needs (04/16/2023)  Utilities: At Risk (04/16/2023)  Tobacco Use: Low Risk  (04/15/2023)     Readmission Risk Interventions    04/23/2023    9:19 AM  Readmission Risk Prevention Plan  Post Dischage Appt Complete  Medication Screening Complete  Transportation Screening Complete

## 2023-04-23 NOTE — Plan of Care (Signed)
  Problem: Acute Rehab PT Goals(only PT should resolve) Goal: Patient Will Transfer Sit To/From Stand Outcome: Adequate for Discharge Goal: Pt Will Ambulate Outcome: Adequate for Discharge Goal: Pt Will Go Up/Down Stairs Outcome: Completed/Met Flowsheets (Taken 04/19/2023 1815 by Ronnie Derby, PT) Pt will Go Up / Down Stairs:  Flight  with supervision Goal: Pt/caregiver will Perform Home Exercise Program Outcome: Adequate for Discharge

## 2023-04-23 NOTE — Progress Notes (Signed)
 Pt sent to discharge lounge, discharge instructions went over with pt. Pt verbalizes understanding of the instructions. Vital signs and assessment stable at discharge.   04/23/2023 Oralia Manis, RN, BSN 9:32 AM

## 2023-04-24 NOTE — Op Note (Signed)
 DIAGNOSTIC CEREBRAL ANGIOGRAM OPERATIVE NOTE   OPERATOR:   Dr. Lisbeth Renshaw, MD  HISTORY:   The patient is a 43 y.o. yo female presenting to the hospital with sudden onset of severe headache.  Her CT scan is demonstrated subarachnoid hemorrhage.  CT angiogram suggested the possibility of an anterior communicating artery aneurysm.  The patient presents for further workup with diagnostic cerebral angiogram.  APPROACH:   The technical aspects of the procedure as well as its potential risks and benefits were reviewed with the patient. These risks included but were not limited bleeding, infection, allergic reaction, damage to organs/vital structures, stroke, non-diagnostic procedure, and the catastrophic outcomes of heart attack, coma, and death. With an understanding of these risks, informed consent was obtained and witnessed.    The patient was placed in the supine position on the angiography table and the skin of right groin prepped in the usual sterile fashion. The procedure was performed under local anesthesia (1%-solution of bicarbonate-bufferred Lidoacaine) with minimal conscious sedation administered by the anesthesia service.  A 5- French sheath was introduced in the right common femoral artery using Seldinger technique.  A fluorophase sequence was used to document the sheath position.    HEPARIN: 0 Units total.   CONTRAST AGENT: See IR records FLUOROSCOPY TIME: See IR records  CATHETER(S) AND WIRE(S):    5-French JB-1 glidecatheter   0.035" glidewire    VESSELS CATHETERIZED:   Right internal carotid   Left internal carotid   Right vertebral   Left vertebral   Right common femoral  VESSELS STUDIED:   Right internal carotid, head Left internal carotid, head Right vertebral, head Left vertebral, head Right femoral  PROCEDURAL NARRATIVE:   A 5-Fr JB-1 terumo glide catheter was advanced over a 0.035 glidewire into the aortic arch. The above vessels were then sequentially  catheterized and cervical/cerebral angiograms taken. After review of images, the catheter was removed without incident.    INTERPRETATION:    Right internal carotid: head:   Injection reveals the presence of a widely patent ICA, M1, and A1 segments and their branches. There is no significant stenosis, occlusion, aneurysm or high flow vascular malformation visualized.  Multiple oblique views of the anterior communicating artery complex does not reveal any saccular aneurysm to correspond with the CT angiogram findings.  The parenchymal and venous phases are normal. The venous sinuses are widely patent.    Left internal carotid: head:   Injection reveals the presence of a widely patent ICA, A1, and M1 segments and their branches. There is no significant stenosis, occlusion, aneurysm, or high flow vascular malformation visualized. The parenchymal and venous phases are normal. The venous sinuses are widely patent.    Right vertebral:   Injection reveals the presence of a widely patent vertebral artery. This leads to a widely patent basilar artery that terminates in bilateral P1. The basilar apex is normal. There is no significant stenosis, occlusion, aneurysm, or vascular malformation visualized. The parenchymal and venous phases are normal. The venous sinuses are widely patent.    Left vertebral:    Normal vessel. No PICA aneurysm. See basilar description above.    Right femoral:    Normal vessel. No significant atherosclerotic disease. Arterial sheath in adequate position.   DISPOSITION:  Upon completion of the study, the femoral sheath was removed and hemostasis obtained using a 5-Fr Angio-Seal closure device. Good proximal and distal lower extremity pulses were documented upon achievement of hemostasis.    The procedure was well tolerated and no  early complications were observed.       The patient was transferred back to the neurointensive care unit to be positioned flat in bed for 3 hours of  observation.    IMPRESSION:  1.  Normal cerebral angiogram without evidence for intracranial aneurysm, arteriovenous malformation, or high flow fistula to explain subarachnoid hemorrhage.  Specifically, no anterior communicating artery aneurysm is noted to correlate with the previous CT angiogram.  The preliminary results of this procedure were shared with the patient and the patient's family.

## 2023-04-24 NOTE — Op Note (Signed)
 DIAGNOSTIC CEREBRAL ANGIOGRAM OPERATIVE NOTE   OPERATOR:   Dr. Lisbeth Renshaw, MD  HISTORY:   The patient is a 43 y.o. yo female presenting to the hospital with sudden onset of severe headache.  She underwent initial diagnostic cerebral angiogram upon admission which was negative for intracranial aneurysm.  She has been monitored in the intensive care unit without change in neurologic condition.  She presents today for routine confirmatory diagnostic cerebral angiogram.  APPROACH:   The technical aspects of the procedure as well as its potential risks and benefits were reviewed with the patient. These risks included but were not limited bleeding, infection, allergic reaction, damage to organs/vital structures, stroke, non-diagnostic procedure, and the catastrophic outcomes of heart attack, coma, and death. With an understanding of these risks, informed consent was obtained and witnessed.    The patient was placed in the supine position on the angiography table and the skin of right groin prepped in the usual sterile fashion. The procedure was performed under local anesthesia (1%-solution of bicarbonate-bufferred Lidoacaine) with conscious sedation with intravenous Versed and fentanyl monitored by the in suite nurse and myself utilizing noninvasive blood pressure and continuous pulse oximetry.  A 5- French sheath was introduced in the right common femoral artery using Seldinger technique.  A fluorophase sequence was used to document the sheath position.    HEPARIN: 0 Units total.   CONTRAST AGENT: See IR records FLUOROSCOPY TIME: See IR records  CATHETER(S) AND WIRE(S):    5-French JB-1 glidecatheter   0.035" glidewire    VESSELS CATHETERIZED:   Right internal carotid   Left internal carotid   Right vertebral   Left vertebral   Right common femoral  VESSELS STUDIED:   Right internal carotid, head Left internal carotid, head Right vertebral, head Left vertebral, head Right  femoral  PROCEDURAL NARRATIVE:   A 5-Fr JB-1 terumo glide catheter was advanced over a 0.035 glidewire into the aortic arch. The above vessels were then sequentially catheterized and cervical/cerebral angiograms taken. After review of images, the catheter was removed without incident.    INTERPRETATION:    Right internal carotid: head:   Injection reveals the presence of a widely patent ICA, M1, and A1 segments and their branches. There is no, aneurysm AVM, or high flow vascular malformation visualized.  There is minimal vasospasm involving the supraclinoid internal carotid artery without any flow limitation.  The parenchymal and venous phases are normal. The venous sinuses are widely patent.    Left internal carotid: head:   Injection reveals the presence of a widely patent ICA, A1, and M1 segments and their branches. There is no aneurysm, or high flow vascular malformation visualized.  There is minimal spasm involving the supraclinoid internal carotid artery without any flow limitation.  The parenchymal and venous phases are normal. The venous sinuses are widely patent.    Right vertebral:   Injection reveals the presence of a widely patent vertebral artery. This leads to a widely patent basilar artery that terminates in bilateral P1. The basilar apex is normal.  No aneurysms, arteriovenous malformations, or high flow fistulas are seen.  There is no vasospasm involving the posterior circulation.  The parenchymal and venous phases are normal. The venous sinuses are widely patent.    Left vertebral:    Normal vessel. No PICA aneurysm. See basilar description above.    Right femoral:    Normal vessel. No significant atherosclerotic disease. Arterial sheath in adequate position.   DISPOSITION:  Upon completion of the study,  the femoral sheath was removed and hemostasis obtained using a 6-Fr Angio-Seal closure device. Good proximal and distal lower extremity pulses were documented upon  achievement of hemostasis.    The procedure was well tolerated and no early complications were observed.  The patient was transferred back to the neurointensive care unit to be positioned flat in bed for 3 hours of observation.    IMPRESSION:  1.  Normal cerebral angiogram without evidence for intracranial aneurysm, arteriovenous malformation, or high flow fistula.  There is minimal vasospasm involving bilateral supraclinoid internal carotid arteries without any flow limitation.    The preliminary results of this procedure were shared with the patient.   Lisbeth Renshaw, MD [facility]

## 2023-04-25 ENCOUNTER — Encounter (HOSPITAL_COMMUNITY): Payer: Self-pay

## 2023-05-01 ENCOUNTER — Telehealth: Payer: Self-pay

## 2023-05-01 DIAGNOSIS — I609 Nontraumatic subarachnoid hemorrhage, unspecified: Secondary | ICD-10-CM

## 2023-05-01 NOTE — Patient Outreach (Signed)
 Emmi Stroke Care Coordination Follow Up  05/01/2023 Name:  Madeline Gomez MRN:  [id number] DOB:  1980/08/11  Subjective: Madeline Gomez is a 43 y.o. year old female who is a primary care patient of Pcp, No An Emmi alert was received indicating patient responded to questions: Feeling worse overall?. I reached out by phone to follow up on the alert and spoke to  no one. No answer .  Care Coordination Interventions:  No, not indicated   Follow up plan:  in 24 hours    Encounter Outcome:  No Answer   Lonia Chimera, RN, BSN, CEN Population Health- Transition of Care Team.  [facility] [phone]

## 2023-05-01 NOTE — Patient Outreach (Signed)
 Emmi Stroke Care Coordination Follow Up  05/01/2023 Name:  [name] MRN:  [id number] DOB:  02-28-1980  Subjective: Madeline Gomez is a 43 y.o. year old female who is a primary care patient of Pcp, No An Emmi alert was received indicating patient responded to questions: Feeling worse overall?. I reached out by phone to follow up on the alert and spoke to  no answer . Placed call to patient with no answer.Left a message with my call back number requesting  patient to call me back.  Care Coordination Interventions:  No, not indicated   Follow up plan:  will call back in 24 hours for second attempt    Encounter Outcome:  No Answer   Lonia Chimera, RN, BSN, CEN Population Health- Transition of Care Team.  [facility] [phone]

## 2023-05-02 ENCOUNTER — Telehealth: Payer: Self-pay

## 2023-05-02 NOTE — Patient Outreach (Signed)
 Emmi Stroke Care Coordination Follow Up  05/02/2023 Name:  Madeline Gomez MRN:  [id number] DOB:  09/12/1980  Subjective: Madeline Gomez is a 43 y.o. year old female who is a primary care patient of Pcp, No An Emmi alert was received indicating patient responded to questions: Feeling worse overall?. I reached out by phone to follow up on the alert and spoke to Patient. Patient reports to me that she does not remember answering the emmi questions, states she remember the call but not her answers.  Reports headache is decreasing. Has follow up planned and  has all medications.  Care Coordination Interventions:  Yes, provided  Interventions Today    Flowsheet Row Most Recent Value  Chronic Disease   Chronic disease during today's visit Other  [CVA]  General Interventions   General Interventions Discussed/Reviewed General Interventions Discussed, Doctor Visits  Doctor Visits Discussed/Reviewed Doctor Visits Discussed, PCP, Specialist  [Confirmed patient has neuro follow up and rehab follow up]  PCP/Specialist Visits Compliance with follow-up visit  Exercise Interventions   Exercise Discussed/Reviewed Physical Activity  Education Interventions   Education Provided Provided Education  [Reviewed with patient the importance of calling 911 for any symptoms of stroke.  Reviewed with patient the importanc of home safety.  Reviewed with pateint the EMMI calls are every 3 days.]  Provided Verbal Education On Nutrition  Nutrition Interventions   Nutrition Discussed/Reviewed Nutrition Discussed  [encouraged patient to eat healthy]      TOC Interventions Today    Flowsheet Row Most Recent Value  TOC Interventions   TOC Interventions Discussed/Reviewed TOC Interventions Discussed       Follow up plan: No further intervention required.   Encounter Outcome:  Patient Visit Completed   Lonia Chimera, RN, BSN, CEN Population Health- Transition of Care Team.  [facility] [phone]

## 2023-05-05 ENCOUNTER — Telehealth: Payer: Self-pay

## 2023-05-05 DIAGNOSIS — S066X0A Traumatic subarachnoid hemorrhage without loss of consciousness, initial encounter: Secondary | ICD-10-CM

## 2023-05-06 ENCOUNTER — Ambulatory Visit: Attending: Neurosurgery | Admitting: Occupational Therapy

## 2023-05-06 ENCOUNTER — Telehealth: Payer: Self-pay | Admitting: Occupational Therapy

## 2023-05-06 ENCOUNTER — Encounter: Payer: Self-pay | Admitting: Occupational Therapy

## 2023-05-06 ENCOUNTER — Other Ambulatory Visit: Payer: Self-pay

## 2023-05-06 ENCOUNTER — Telehealth: Payer: Self-pay

## 2023-05-06 DIAGNOSIS — R29818 Other symptoms and signs involving the nervous system: Secondary | ICD-10-CM | POA: Diagnosis not present

## 2023-05-06 DIAGNOSIS — R41844 Frontal lobe and executive function deficit: Secondary | ICD-10-CM | POA: Insufficient documentation

## 2023-05-06 DIAGNOSIS — S066X0A Traumatic subarachnoid hemorrhage without loss of consciousness, initial encounter: Secondary | ICD-10-CM | POA: Insufficient documentation

## 2023-05-06 DIAGNOSIS — R531 Weakness: Secondary | ICD-10-CM | POA: Diagnosis not present

## 2023-05-06 DIAGNOSIS — M6281 Muscle weakness (generalized): Secondary | ICD-10-CM | POA: Diagnosis not present

## 2023-05-06 NOTE — Telephone Encounter (Signed)
 Dr. Conchita Paris, [name] was evaluated by OT today.  The patient would benefit from PT evaluation for assessment and treatment of gait and LE weakness.   If you agree, please place an order in [facility] workque in [facility] or fax the order to [phone]. Thank you,  Shelbie Proctor, OTR/L  [facility] [address] [place], [place]  [address] Phone:  [phone] Fax:  [phone]

## 2023-05-06 NOTE — Patient Outreach (Signed)
 Emmi Stroke Care Coordination Follow Up  05/06/2023 Name:  KENLIE SEKI MRN:  [id number] DOB:  [birth year]  Subjective: Madeline Gomez is a 43 y.o. year old female who is a primary care patient of Pcp, No An Emmi alert was received indicating patient responded to questions: Sad, hopeless, anxious, or empty?. I reached out by phone to follow up on the alert and spoke to  no answer. .  Care Coordination Interventions:  No, not indicated   Follow up plan:  will attempt 3rd attempt in 24 hours.    Encounter Outcome:  No Answer   Lonia Chimera, RN, BSN, CEN Population Health- Transition of Care Team.  [facility] [phone]

## 2023-05-06 NOTE — Patient Instructions (Signed)
 Activities to try at home to encourage visual scanning:   1. Word searches 2. Mazes 3. Puzzles 4. Card games 5. Connect-the-dots  Activities for environmental (larger) scanning:  1. With supervision, scan for items in grocery store or drugstore.  Begin with a familiar store, then progress to a new store you've never been in before. Make sure you have supervision with this.   2. With supervision, tell a family member or caregiver when it is safe to cross a street after looking all directions and any side streets. However, do NOT cross street unless family member or caregiver is with you and says it is OK

## 2023-05-06 NOTE — Patient Outreach (Signed)
 Emmi Stroke Care Coordination Follow Up  05/06/2023 Name:  Madeline Gomez MRN:  [id number] DOB:  [birth year]  Subjective: Madeline Gomez is a 43 y.o. year old female who is a primary care patient of Pcp, No An Emmi alert was received indicating patient responded to questions: Sad, hopeless, anxious, or empty?. I reached out by phone to follow up on the alert and spoke to Patient.  When I spoke with patient and reviewed the reason for call. ( Emmi red flag- reviewed if patient was depressed and then patient reports that she is in the bed asleep and to call back.   Care Coordination Interventions:  No, not indicated   Follow up plan:  next day call back    Encounter Outcome:  Patient Request to Call Back   Lonia Chimera, RN, BSN, CEN Population Health- Transition of Care Team.  [facility] [phone]

## 2023-05-06 NOTE — Therapy (Signed)
 OUTPATIENT OCCUPATIONAL THERAPY NEURO EVALUATION  Patient Name: Madeline Gomez MRN: [id number] DOB:1981-01-13, 43 y.o., female Today's Date: 05/06/2023  PCP: no PCP REFERRING PROVIDER: Lisbeth Renshaw, MD  END OF SESSION:  OT End of Session - 05/06/23 1101     Visit Number 1    Number of Visits 7    Date for OT Re-Evaluation 06/20/23    Authorization Type BC[employer]    OT Start Time 1102    OT Stop Time 1144    OT Time Calculation (min) 42 min    Activity Tolerance Patient tolerated treatment well    Behavior During Therapy WFL for tasks assessed/performed            Past Medical History:  Diagnosis Date   Anemia    Asthma    Broken bones    RIGHT FOOT    BV (bacterial vaginosis)    RECURRENT   H/O vitamin D deficiency    Thyroid disease    Past Surgical History:  Procedure Laterality Date   CESAREAN SECTION     IR ANGIO INTRA EXTRACRAN SEL INTERNAL CAROTID BILAT MOD SED  04/15/2023   IR ANGIO INTRA EXTRACRAN SEL INTERNAL CAROTID BILAT MOD SED  04/22/2023   IR ANGIO VERTEBRAL SEL VERTEBRAL BILAT MOD SED  04/15/2023   IR ANGIO VERTEBRAL SEL VERTEBRAL BILAT MOD SED  04/22/2023   IR US GUIDE VASC ACCESS RIGHT  04/15/2023   RADIOLOGY WITH ANESTHESIA N/A 04/15/2023   Procedure: RADIOLOGY WITH ANESTHESIA;  Surgeon: Lisbeth Renshaw, MD;  Location: [facility] OR;  Service: Radiology;  Laterality: N/A;   WISDOM TOOTH EXTRACTION     Patient Active Problem List   Diagnosis Date Noted   Mild persistent asthma without complication 04/17/2023   Asthma, persistent controlled 04/16/2023   SAH (subarachnoid hemorrhage) (HCC) 04/14/2023   Allergic rhinitis 01/02/2015   Asthma with acute exacerbation 05/02/2014   Asthma, chronic 07/06/2012    ONSET DATE: 04/23/2023 (Date of referral)  REFERRING DIAG:  S06.6X0A (ICD-10-CM) - Subarachnoid hematoma, without loss of consciousness, initial encounter (HCC)  R53.1 (ICD-10-CM) - Weakness   THERAPY DIAG:  Muscle weakness  (generalized)  Rationale for Evaluation and Treatment: Rehabilitation  SUBJECTIVE:   SUBJECTIVE STATEMENT: Pt follows up with Dr. Conchita Paris this week.  Pt accompanied by: self  PERTINENT HISTORY: PMH: asthma, thyroid disease, anemia   PRECAUTIONS: Fall  WEIGHT BEARING RESTRICTIONS: No  PAIN:  Are you having pain? Yes: NPRS scale: 3/10 Pain location: headache Pain description: ache Aggravating factors: stress Relieving factors: rest; medication  FALLS: Has patient fallen in last 6 months? Yes. Number of falls pt slipped in the tub a couple months back  LIVING ENVIRONMENT: Family/patient expects to be discharged to:: Private residence Living Arrangements: Children;Spouse/significant other Available Help at Discharge: Family;Available PRN/intermittently Type of Home: House Home Access: Stairs to enter [employer] of Steps: 3 Entrance Stairs-Rails: None Home Layout: Two level;1/2 bath on main level;Bed/bath upstairs Alternate Level Stairs-Number of Steps: flight Alternate Level Stairs-Rails: None Bathroom Shower/Tub: Engineer, manufacturing systems: Standard Home Equipment: Hand held shower head Additional Comments: pt lives with 16, 17, and 56 yo children  PLOF: Independent; working as a Geophysicist/field seismologist; driving  PATIENT GOALS: return to PLOF  OBJECTIVE:  Note: Objective measures were completed at Evaluation unless otherwise noted.  HAND DOMINANCE: Right  ADLs: Overall ADLs: mod I  IADLs: Shopping: dependent Light housekeeping: mod I Meal Prep: dependent Community mobility: dependent Medication management: mod I Financial management: mod I Handwriting:  no  changes  MOBILITY STATUS: Independent  ACTIVITY TOLERANCE: Activity tolerance: fair  FUNCTIONAL OUTCOME MEASURES: PSFS: TBD  UPPER EXTREMITY ROM:    BUE: WNL  UPPER EXTREMITY MMT:     LUE is grossly 5/5; RUE is grossly 4/5  HAND FUNCTION: Grip strength: Right: 32 lbs; Left:  36 lbs  COORDINATION: 9 Hole Peg test: Right: 37 then 29 seconds sec; Left: 30 sec - therapist modified test by holding peg for pt to pick up given length of finger nails.  SENSATION: WFL  EDEMA: none reported or observed  MUSCLE TONE: BUE WNL  COGNITION: Overall cognitive status: Within functional limits for tasks assessed  VISION: Subjective report: difficulty tolerating computer use Baseline vision: Wears glasses all the time and Wears glasses for distance only Visual history: none  VISION ASSESSMENT: WFL - able to read clock on wall and paper on table  PERCEPTION: WFL  PRAXIS: WFL  OBSERVATIONS: Pt appears well-kept wearing glasses and                                                                                                            TREATMENT:    OT educated pt on visual scanning activities as noted in pt instructions. Pt was educated to reduce/avoid screen time as this provokes headaches.   PATIENT EDUCATION: Education details: OT role and POC; visual scanning activities Person educated: Patient Education method: Explanation and Handouts Education comprehension: verbalized understanding, returned demonstration, verbal cues required, and needs further education  HOME EXERCISE PROGRAM: 05/06/2023: visual scanning activities  GOALS:  LONG TERM GOALS: Target date: 06/20/23   Patient will demonstrate updated RUE HEP with visual handouts only for proper execution. Baseline:  Goal status: INITIAL  2.  Patient will report at least two-point increase in average PSFS score or at least three-point increase in a single activity score indicating functionally significant improvement given minimum detectable change.  Baseline: TBD total score (See above for individual activity scores)  Goal status: INITIAL  3.  Patient will independently verbalize at least 3 energy conservation principles in relation to ADLs to increase functional independence.  Baseline:  Goal  status: INITIAL  4.  Pt will demonstrate improvement to RUE MMT for shoulder flexion, abduction, elbow ext and flex.  Baseline: 4/5 Goal status: INITIAL   ASSESSMENT:  CLINICAL IMPRESSION: Patient is a 43 y.o. female who was seen today for occupational therapy evaluation for SAH. Hx includes asthma, thyroid disease, anemia. Patient currently presents below baseline level of functioning demonstrating functional deficits and impairments as noted below. Pt would benefit from skilled OT services in the outpatient setting to work on impairments as noted below to help pt return to PLOF as able.    PERFORMANCE DEFICITS: in functional skills including ADLs, IADLs, strength, mobility, endurance, and UE functional use.   IMPAIRMENTS: are limiting patient from ADLs, IADLs, work, and leisure.   CO-MORBIDITIES: may have co-morbidities  that affects occupational performance. Patient will benefit from skilled OT to address above impairments and improve overall function.  MODIFICATION OR ASSISTANCE TO COMPLETE EVALUATION:  Min-Moderate modification of tasks or assist with assess necessary to complete an evaluation.  OT OCCUPATIONAL PROFILE AND HISTORY: Detailed assessment: Review of records and additional review of physical, cognitive, psychosocial history related to current functional performance.  CLINICAL DECISION MAKING: Moderate - several treatment options, min-mod task modification necessary  REHAB POTENTIAL: Good  EVALUATION COMPLEXITY: Moderate    PLAN:  OT FREQUENCY: 1x/week  OT DURATION: 6 weeks  PLANNED INTERVENTIONS: 97168 OT Re-evaluation, 97535 self care/ADL training, 53664 therapeutic exercise, 97530 therapeutic activity, 97112 neuromuscular re-education, functional mobility training, visual/perceptual remediation/compensation, energy conservation, coping strategies training, patient/family education, and DME and/or AE instructions  RECOMMENDED OTHER SERVICES: Recommend PT eval  and treat given reports of leg weakness, altered, slow gait, and increased risk of falls.   CONSULTED AND AGREED WITH PLAN OF CARE: Patient  PLAN FOR NEXT SESSION: Complete PSFS and update goal; Review vision strategies; theraband HEP   Delana Meyer, OT 05/06/2023, 3:24 PM

## 2023-05-07 ENCOUNTER — Telehealth: Payer: Self-pay

## 2023-05-07 DIAGNOSIS — Z6838 Body mass index (BMI) 38.0-38.9, adult: Secondary | ICD-10-CM | POA: Diagnosis not present

## 2023-05-07 DIAGNOSIS — I609 Nontraumatic subarachnoid hemorrhage, unspecified: Secondary | ICD-10-CM | POA: Diagnosis not present

## 2023-05-07 NOTE — Patient Outreach (Signed)
 Emmi Stroke Care Coordination Follow Up  05/07/2023 Name:  Madeline Gomez MRN:  [id number] DOB:  [birth year]  Subjective: Madeline Gomez is a 43 y.o. year old female who is a primary care patient of Pcp, No An Emmi alert was received indicating patient responded to questions: Sad, hopeless, anxious, or empty?. I reached out by phone to follow up on the alert and spoke to  no answer .  Care Coordination Interventions:  No, not indicated   Follow up plan: No further intervention required. Unable to reach  Encounter Outcome:  No Answer   Lonia Chimera, RN, BSN, CEN Population Health- Transition of Care Team.  [facility] [phone]

## 2023-05-13 ENCOUNTER — Ambulatory Visit: Admitting: Occupational Therapy

## 2023-05-13 DIAGNOSIS — M6281 Muscle weakness (generalized): Secondary | ICD-10-CM

## 2023-05-13 DIAGNOSIS — R41844 Frontal lobe and executive function deficit: Secondary | ICD-10-CM | POA: Diagnosis not present

## 2023-05-13 DIAGNOSIS — R531 Weakness: Secondary | ICD-10-CM | POA: Diagnosis not present

## 2023-05-13 DIAGNOSIS — R29818 Other symptoms and signs involving the nervous system: Secondary | ICD-10-CM | POA: Diagnosis not present

## 2023-05-13 DIAGNOSIS — S066X0A Traumatic subarachnoid hemorrhage without loss of consciousness, initial encounter: Secondary | ICD-10-CM | POA: Diagnosis not present

## 2023-05-13 NOTE — Therapy (Signed)
 OUTPATIENT OCCUPATIONAL THERAPY NEURO TREATMENT  Patient Name: Madeline Gomez MRN: [id number] DOB:22-Jun-1980, 43 y.o., female Today's Date: 05/13/2023  PCP: no PCP REFERRING PROVIDER: Lisbeth Renshaw, MD  END OF SESSION:  OT End of Session - 05/13/23 1450     Visit Number 2    Number of Visits 7    Date for OT Re-Evaluation 06/20/23    Authorization Type BCBS    OT Start Time 1450    OT Stop Time 1544    OT Time Calculation (min) 54 min    Activity Tolerance Patient tolerated treatment well    Behavior During Therapy WFL for tasks assessed/performed             Past Medical History:  Diagnosis Date   Anemia    Asthma    Broken bones    RIGHT FOOT    BV (bacterial vaginosis)    RECURRENT   H/O vitamin D deficiency    Thyroid disease    Past Surgical History:  Procedure Laterality Date   CESAREAN SECTION     IR ANGIO INTRA EXTRACRAN SEL INTERNAL CAROTID BILAT MOD SED  04/15/2023   IR ANGIO INTRA EXTRACRAN SEL INTERNAL CAROTID BILAT MOD SED  04/22/2023   IR ANGIO VERTEBRAL SEL VERTEBRAL BILAT MOD SED  04/15/2023   IR ANGIO VERTEBRAL SEL VERTEBRAL BILAT MOD SED  04/22/2023   IR US GUIDE VASC ACCESS RIGHT  04/15/2023   RADIOLOGY WITH ANESTHESIA N/A 04/15/2023   Procedure: RADIOLOGY WITH ANESTHESIA;  Surgeon: Lisbeth Renshaw, MD;  Location: [facility] OR;  Service: Radiology;  Laterality: N/A;   WISDOM TOOTH EXTRACTION     Patient Active Problem List   Diagnosis Date Noted   Mild persistent asthma without complication 04/17/2023   Asthma, persistent controlled 04/16/2023   SAH (subarachnoid hemorrhage) (HCC) 04/14/2023   Allergic rhinitis 01/02/2015   Asthma with acute exacerbation 05/02/2014   Asthma, chronic 07/06/2012    ONSET DATE: 04/23/2023 (Date of referral)  REFERRING DIAG:  S06.6X0A (ICD-10-CM) - Subarachnoid hematoma, without loss of consciousness, initial encounter (HCC)  R53.1 (ICD-10-CM) - Weakness   THERAPY DIAG:  Muscle weakness  (generalized)  Rationale for Evaluation and Treatment: Rehabilitation  SUBJECTIVE:   SUBJECTIVE STATEMENT: Pt has been trying to remember how to play golf solitaire.   Pt accompanied by: self  PERTINENT HISTORY: PMH: asthma, thyroid disease, anemia   PRECAUTIONS: Fall  WEIGHT BEARING RESTRICTIONS: No  PAIN:  Are you having pain? Yes: NPRS scale: 3/10 Pain location: headache Pain description: ache Aggravating factors: stress Relieving factors: rest; medication  FALLS: Has patient fallen in last 6 months? Yes. Number of falls pt slipped in the tub a couple months back  LIVING ENVIRONMENT: Family/patient expects to be discharged to:: Private residence Living Arrangements: Children;Spouse/significant other Available Help at Discharge: Family;Available PRN/intermittently Type of Home: House Home Access: Stairs to enter [employer] of Steps: 3 Entrance Stairs-Rails: None Home Layout: Two level;1/2 bath on main level;Bed/bath upstairs Alternate Level Stairs-Number of Steps: flight Alternate Level Stairs-Rails: None Bathroom Shower/Tub: Engineer, manufacturing systems: Standard Home Equipment: Hand held shower head Additional Comments: pt lives with 16, 17, and 32 yo children  PLOF: Independent; working as a Geophysicist/field seismologist; driving  PATIENT GOALS: return to PLOF  OBJECTIVE:  Note: Objective measures were completed at Evaluation unless otherwise noted.  HAND DOMINANCE: Right  ADLs: Overall ADLs: mod I  IADLs: Shopping: dependent Light housekeeping: mod I Meal Prep: dependent Community mobility: dependent Medication management: mod I Financial management:  mod I Handwriting:  no changes  MOBILITY STATUS: Independent  ACTIVITY TOLERANCE: Activity tolerance: fair  FUNCTIONAL OUTCOME MEASURES: 05/13/2023 - PSFS: 1.3   UPPER EXTREMITY ROM:    BUE: WNL  UPPER EXTREMITY MMT:     LUE is grossly 5/5; RUE is grossly 4/5  HAND FUNCTION: Grip  strength: Right: 32 lbs; Left: 36 lbs  COORDINATION: 9 Hole Peg test: Right: 37 then 29 seconds sec; Left: 30 sec - therapist modified test by holding peg for pt to pick up given length of finger nails.  SENSATION: WFL  EDEMA: none reported or observed  MUSCLE TONE: BUE WNL  COGNITION: Overall cognitive status: Within functional limits for tasks assessed  VISION: Subjective report: difficulty tolerating computer use Baseline vision: Wears glasses all the time and Wears glasses for distance only Visual history: none  VISION ASSESSMENT: WFL - able to read clock on wall and paper on table  PERCEPTION: WFL  PRAXIS: WFL  OBSERVATIONS: Pt appears well-kept wearing glasses and                                                                                                            TREATMENT:   - Self-care/home management completed for duration as noted below including: OT reviewed visual scanning activities as noted in pt instructions. Pt was educated to reduce/avoid screen time as this provokes headaches.   Objective measures assessed as noted in Goals section to determine progression towards goals. OT educated patient on the 4 Ps of energy conservation including positioning, pace, prioritizing, and planning to maximize efficiency and safety with completion of functional activities as desired.  Patient verbalized understanding of all information provided and was given a packet to take home for review. - Therapeutic activities completed for duration as noted below including: OT reviewed table top play of Golf Solitaire for BUE ROM, coordination, visual processing, scanning, and sequencing. Pt required minimal cues initially for proper play progressing to independent play.  - Therapeutic exercises completed for duration as noted below including: OT initiated RUE red Thera-Band HEP including shoulder flexion, horizontal abd/add, bicep curl, and tricep extension to promote strengthening  of affected extremity and overall endurance as noted in pt instructions.    PATIENT EDUCATION: Education details: PSFS; energy conservation; visual scanning activities; theraband HEP Person educated: Patient Education method: Explanation, Demonstration, Verbal cues, and Handouts Education comprehension: verbalized understanding, returned demonstration, verbal cues required, and needs further education  HOME EXERCISE PROGRAM: 05/06/2023: visual scanning activities 05/13/2023: red theraband and EC strategies  GOALS:  LONG TERM GOALS: Target date: 06/20/23   Patient will demonstrate updated RUE HEP with visual handouts only for proper execution. Baseline:  Goal status: INITIAL  2.  Patient will report at least two-point increase in average PSFS score or at least three-point increase in a single activity score indicating functionally significant improvement given minimum detectable change.  Baseline: 1.3 total score (See above for individual activity scores)  Goal status: INITIAL  3.  Patient will independently verbalize at least 3 energy conservation principles in relation to  ADLs to increase functional independence.  Baseline:  Goal status: INITIAL  4.  Pt will demonstrate improvement to RUE MMT for shoulder flexion, abduction, elbow ext and flex.  Baseline: 4/5 Goal status: INITIAL   ASSESSMENT:  CLINICAL IMPRESSION: Patient demonstrates good understanding of activities, exercises, and strategies this visit as needed to progress towards goals. Will reassess pt's progress once she returns from her visit with family in 2 weeks.   PERFORMANCE DEFICITS: in functional skills including ADLs, IADLs, strength, mobility, endurance, and UE functional use.   IMPAIRMENTS: are limiting patient from ADLs, IADLs, work, and leisure.   CO-MORBIDITIES: may have co-morbidities  that affects occupational performance. Patient will benefit from skilled OT to address above impairments and improve  overall function.  REHAB POTENTIAL: Good  PLAN:  OT FREQUENCY: 1x/week  OT DURATION: 6 weeks  PLANNED INTERVENTIONS: 97168 OT Re-evaluation, 97535 self care/ADL training, 16109 therapeutic exercise, 97530 therapeutic activity, 97112 neuromuscular re-education, functional mobility training, visual/perceptual remediation/compensation, energy conservation, coping strategies training, patient/family education, and DME and/or AE instructions  RECOMMENDED OTHER SERVICES: Recommend PT eval and treat given reports of leg weakness, altered, slow gait, and increased risk of falls.   CONSULTED AND AGREED WITH PLAN OF CARE: Patient  PLAN FOR NEXT SESSION: Review theraband HEP; PT referral received?; assess goals   Delana Meyer, OT 05/13/2023, 4:01 PM

## 2023-05-13 NOTE — Patient Instructions (Addendum)
 Upper Body Strengthening Exercises Comments Sit upright, away from the back of your chair. Keep abdominal muscles engaged i.e. pull belly button to spine.  FOR ALL EXERCISES: Repeat 10 Times  Hold 3 Seconds  Complete 1 Set  Perform 2 Times a Day  ELASTIC BAND FLEXION   Place unaffected arm on your leg or hip. With your affected arm, hold elastic band in front of you and pull the band upward towards the ceiling as shown.    ELASTIC BAND HORIZONTAL ABDUCTION - SCAPULAR RETRACTION  Start by holding elastic band in front of your chest with your elbows straight. Then, pull your arms apart and towards the side while squeezing your shoulder blades together. Return to starting position and repeat.             ELASTIC BAND TRICEPS EXTENSION  While seated, hold and fixate one end of an elastic band against your chest. Hold the other end with your opposite hand with your elbow bent and arm by your side.   Start by pulling the band downward so that the elbow goes from a bent position to a straightened position as shown. Return to starting position and repeat.   BICEPS CURL WITH BAND  While sitting in an upright position, put an elastic band underneath your feet (as pictured)  OR underneath your thighs. Grip each end of the band, keep the elbows tucked to the body, and bring hands to shoulders by bending at the elbows.

## 2023-05-20 ENCOUNTER — Encounter: Admitting: Occupational Therapy

## 2023-05-27 ENCOUNTER — Ambulatory Visit: Admitting: Occupational Therapy

## 2023-05-27 DIAGNOSIS — S066X0A Traumatic subarachnoid hemorrhage without loss of consciousness, initial encounter: Secondary | ICD-10-CM | POA: Diagnosis not present

## 2023-05-27 DIAGNOSIS — R531 Weakness: Secondary | ICD-10-CM | POA: Diagnosis not present

## 2023-05-27 DIAGNOSIS — R41844 Frontal lobe and executive function deficit: Secondary | ICD-10-CM | POA: Diagnosis not present

## 2023-05-27 DIAGNOSIS — M6281 Muscle weakness (generalized): Secondary | ICD-10-CM

## 2023-05-27 DIAGNOSIS — R29818 Other symptoms and signs involving the nervous system: Secondary | ICD-10-CM | POA: Diagnosis not present

## 2023-05-27 NOTE — Patient Instructions (Addendum)
 Memory Compensation Strategies  Use "WARM" strategy. W= write it down A=  associate it R=  repeat it M=  make a mental picture  You can keep a Memory Notebook. Use a 3-ring notebook with sections for the following:  calendar, important names and phone numbers, medications, doctors' names/phone numbers, "to do list"/reminders, and a section to journal what you did each day  Use a calendar to write appointments down.  Write yourself a schedule for the day.  This can be placed on the calendar or in a separate section of the Memory Notebook.  Keeping a regular schedule can help memory.  Use medication organizer with sections for each day or morning/evening pills  You may need help loading it  Keep a basket, or pegboard by the door.   Place items that you need to take out with you in the basket or on the pegboard.  You may also want to include a message board for reminders.  Use sticky notes. Place sticky notes with reminders in a place where the task is performed.  For example:  "turn off the stove" placed by the stove, "lock the door" placed on the door at eye level, "take your medications" on the bathroom mirror or by the place where you normally take your medications  Use alarms, timers, and/or a reminder app. Use while cooking to remind yourself to check on food or as a reminder to take your medicine, or as a reminder to make a call, or as a reminder to perform another task, etc.  Use a voice recorder app or small tape recorder to record important information and notes for yourself. Go back at the end of the day and listen to these.  YouTube [employer] [facility] - progressive muscle relaxation [email or web address]   [employer] [facility] - deep breathing [email or web address] Diaphragmatic breathing "belly breathing" 4-7-8 breathing (in-hold-out)   [facility] - guided  imagery [email or web address]

## 2023-05-27 NOTE — Therapy (Signed)
 OUTPATIENT OCCUPATIONAL THERAPY NEURO TREATMENT  Patient Name: Madeline Gomez MRN: [id number] DOB:[birth year], 43 y.o., female Today's Date: 05/27/2023  PCP: no PCP REFERRING PROVIDER: Augusto Blonder, MD  END OF SESSION:  OT End of Session - 05/27/23 1105     Visit Number 3    Number of Visits 7    Date for OT Re-Evaluation 06/20/23    Authorization Type BCBS    OT Start Time 1105    OT Stop Time 1201    OT Time Calculation (min) 56 min    Activity Tolerance Patient tolerated treatment well    Behavior During Therapy WFL for tasks assessed/performed             Past Medical History:  Diagnosis Date   Anemia    Asthma    Broken bones    RIGHT FOOT    BV (bacterial vaginosis)    RECURRENT   H/O vitamin D  deficiency    Thyroid  disease    Past Surgical History:  Procedure Laterality Date   CESAREAN SECTION     IR ANGIO INTRA EXTRACRAN SEL INTERNAL CAROTID BILAT MOD SED  04/15/2023   IR ANGIO INTRA EXTRACRAN SEL INTERNAL CAROTID BILAT MOD SED  04/22/2023   IR ANGIO VERTEBRAL SEL VERTEBRAL BILAT MOD SED  04/15/2023   IR ANGIO VERTEBRAL SEL VERTEBRAL BILAT MOD SED  04/22/2023   IR US  GUIDE VASC ACCESS RIGHT  04/15/2023   RADIOLOGY WITH ANESTHESIA N/A 04/15/2023   Procedure: RADIOLOGY WITH ANESTHESIA;  Surgeon: Augusto Blonder, MD;  Location: [facility] OR;  Service: Radiology;  Laterality: N/A;   WISDOM TOOTH EXTRACTION     Patient Active Problem List   Diagnosis Date Noted   Mild persistent asthma without complication 04/17/2023   Asthma, persistent controlled 04/16/2023   SAH (subarachnoid hemorrhage) (HCC) 04/14/2023   Allergic rhinitis 01/02/2015   Asthma with acute exacerbation 05/02/2014   Asthma, chronic 07/06/2012    ONSET DATE: 04/23/2023 (Date of referral)  REFERRING DIAG:  S06.6X0A (ICD-10-CM) - Subarachnoid hematoma, without loss of consciousness, initial encounter (HCC)  R53.1 (ICD-10-CM) - Weakness   THERAPY DIAG:  Muscle weakness  (generalized)  Rationale for Evaluation and Treatment: Rehabilitation  SUBJECTIVE:   SUBJECTIVE STATEMENT: Pt has been getting up and down from the chair easier but is still wall walking for balance within the home.   She can play 3 rounds of golf solitaire now before getting a headache.   Pt reports she forgot about her theraband exercises.   Pt accompanied by: self  PERTINENT HISTORY: PMH: asthma, thyroid  disease, anemia   PRECAUTIONS: Fall  WEIGHT BEARING RESTRICTIONS: No  PAIN:  Are you having pain? Yes: NPRS scale: 2/10 Pain location: headache Pain description: ache Aggravating factors: stress Relieving factors: rest; medication  FALLS: Has patient fallen in last 6 months? Yes. Number of falls pt slipped in the tub a couple months back  LIVING ENVIRONMENT: Family/patient expects to be discharged to:: Private residence Living Arrangements: Children;Spouse/significant other Available Help at Discharge: Family;Available PRN/intermittently Type of Home: House Home Access: Stairs to enter [employer] of Steps: 3 Entrance Stairs-Rails: None Home Layout: Two level;1/2 bath on main level;Bed/bath upstairs Alternate Level Stairs-Number of Steps: flight Alternate Level Stairs-Rails: None Bathroom Shower/Tub: Engineer, manufacturing systems: Standard Home Equipment: Hand held shower head Additional Comments: pt lives with 16, 17, and 16 yo children  PLOF: Independent; working as a Geophysicist/field seismologist; driving  PATIENT GOALS: return to PLOF  OBJECTIVE:  Note: Objective measures were completed  at Evaluation unless otherwise noted.  HAND DOMINANCE: Right  ADLs: Overall ADLs: mod I  IADLs: Shopping: dependent Light housekeeping: mod I Meal Prep: dependent Community mobility: dependent Medication management: mod I Financial management: mod I Handwriting:  no changes  MOBILITY STATUS: Independent  ACTIVITY TOLERANCE: Activity tolerance:  fair  FUNCTIONAL OUTCOME MEASURES: 05/13/2023 - PSFS: 1.3   UPPER EXTREMITY ROM:    BUE: WNL  UPPER EXTREMITY MMT:     LUE is grossly 5/5; RUE is grossly 4/5  HAND FUNCTION: Grip strength: Right: 32 lbs; Left: 36 lbs  COORDINATION: 9 Hole Peg test: Right: 37 then 29 seconds sec; Left: 30 sec - therapist modified test by holding peg for pt to pick up given length of finger nails.  SENSATION: WFL  EDEMA: none reported or observed  MUSCLE TONE: BUE WNL  COGNITION: Overall cognitive status: Within functional limits for tasks assessed  VISION: Subjective report: difficulty tolerating computer use Baseline vision: Wears glasses all the time and Wears glasses for distance only Visual history: none  VISION ASSESSMENT: WFL - able to read clock on wall and paper on table  PERCEPTION: WFL  PRAXIS: WFL  OBSERVATIONS: Pt appears well-kept wearing glasses and                                                                                                            TREATMENT:   - Self-care/home management completed for duration as noted below including: OT educated pt on safe strategies to obtain items from fridge, counter, oven, and cabinets by holding onto solid items for additional support. Pt was encouraged to leave pressure cooker on counter due to its weight and difficulty getting in and out of cabinets.   OT educated pt on stress reduction and memory strategies as noted in pt instructions as needed to improve independence and safety with ADL and IADL completion.   - Therapeutic exercises completed for duration as noted below including: OT reviewed RUE red Thera-Band HEP including shoulder flexion, horizontal abd/add, bicep curl, and tricep extension to promote strengthening of affected extremity and overall endurance as noted in pt instructions. Pt requiring mod cueing for proper completion.    PATIENT EDUCATION: Education details: PSFS; energy conservation; visual  scanning activities; theraband HEP Person educated: Patient Education method: Explanation, Demonstration, Verbal cues, and Handouts Education comprehension: verbalized understanding, returned demonstration, verbal cues required, and needs further education  HOME EXERCISE PROGRAM: 05/06/2023: visual scanning activities 05/13/2023: red theraband and EC strategies 05/27/2023: memory strategies; stress reduction techniques  GOALS:  LONG TERM GOALS: Target date: 06/20/23   Patient will demonstrate updated RUE HEP with visual handouts only for proper execution. Baseline:  05/27/2023: mod cueing Goal status: IN PROGRESS  2.  Patient will report at least two-point increase in average PSFS score or at least three-point increase in a single activity score indicating functionally significant improvement given minimum detectable change.  Baseline: 1.3 total score (See above for individual activity scores)  Goal status: INITIAL  3.  Patient will independently verbalize at least 3 energy conservation  principles in relation to ADLs to increase functional independence.  Baseline:  05/27/2023: unable to recall Goal status: IN PROGRESS  4.  Pt will demonstrate improvement to RUE MMT for shoulder flexion, abduction, elbow ext and flex.  Baseline: 4/5 Goal status: IN PROGRESS   ASSESSMENT:  CLINICAL IMPRESSION: Patient demonstrates good understanding of recommendations though will require additional review of HEP and EC strategies as needed to progress towards goals.   PERFORMANCE DEFICITS: in functional skills including ADLs, IADLs, strength, mobility, endurance, and UE functional use.   IMPAIRMENTS: are limiting patient from ADLs, IADLs, work, and leisure.   CO-MORBIDITIES: may have co-morbidities  that affects occupational performance. Patient will benefit from skilled OT to address above impairments and improve overall function.  REHAB POTENTIAL: Good  PLAN:  OT FREQUENCY: 1x/week  OT  DURATION: 6 weeks  PLANNED INTERVENTIONS: 97168 OT Re-evaluation, 97535 self care/ADL training, 16109 therapeutic exercise, 97530 therapeutic activity, 97112 neuromuscular re-education, functional mobility training, visual/perceptual remediation/compensation, energy conservation, coping strategies training, patient/family education, and DME and/or AE instructions  RECOMMENDED OTHER SERVICES: Recommend PT eval and treat given reports of leg weakness, altered, slow gait, and increased risk of falls.   CONSULTED AND AGREED WITH PLAN OF CARE: Patient  PLAN FOR NEXT SESSION: Review theraband HEP; assess goals; how are kitchen modifications going?   Altamease Asters, OT 05/27/2023, 1:50 PM

## 2023-06-02 NOTE — Therapy (Unsigned)
 OUTPATIENT OCCUPATIONAL THERAPY NEURO TREATMENT  Patient Name: Madeline Gomez MRN: [id number] DOB:19-Oct-1980, 43 y.o., female Today's Date: 06/03/2023  PCP: no PCP REFERRING PROVIDER: Augusto Blonder, MD  END OF SESSION:  OT End of Session - 06/03/23 1154     Visit Number 4    Number of Visits 7    Date for OT Re-Evaluation 06/20/23    Authorization Type [employer]    OT Start Time 1154    OT Stop Time 1232    OT Time Calculation (min) 38 min    Activity Tolerance Patient tolerated treatment well    Behavior During Therapy WFL for tasks assessed/performed            Past Medical History:  Diagnosis Date   Anemia    Asthma    Broken bones    RIGHT FOOT    BV (bacterial vaginosis)    RECURRENT   H/O vitamin D  deficiency    Thyroid  disease    Past Surgical History:  Procedure Laterality Date   CESAREAN SECTION     IR ANGIO INTRA EXTRACRAN SEL INTERNAL CAROTID BILAT MOD SED  04/15/2023   IR ANGIO INTRA EXTRACRAN SEL INTERNAL CAROTID BILAT MOD SED  04/22/2023   IR ANGIO VERTEBRAL SEL VERTEBRAL BILAT MOD SED  04/15/2023   IR ANGIO VERTEBRAL SEL VERTEBRAL BILAT MOD SED  04/22/2023   IR US  GUIDE VASC ACCESS RIGHT  04/15/2023   RADIOLOGY WITH ANESTHESIA N/A 04/15/2023   Procedure: RADIOLOGY WITH ANESTHESIA;  Surgeon: Augusto Blonder, MD;  Location: [facility] OR;  Service: Radiology;  Laterality: N/A;   WISDOM TOOTH EXTRACTION     Patient Active Problem List   Diagnosis Date Noted   Mild persistent asthma without complication 04/17/2023   Asthma, persistent controlled 04/16/2023   SAH (subarachnoid hemorrhage) (HCC) 04/14/2023   Allergic rhinitis 01/02/2015   Asthma with acute exacerbation 05/02/2014   Asthma, chronic 07/06/2012   ONSET DATE: 04/23/2023 (Date of referral)  REFERRING DIAG:  S06.6X0A (ICD-10-CM) - Subarachnoid hematoma, without loss of consciousness, initial encounter (HCC)  R53.1 (ICD-10-CM) - Weakness   THERAPY DIAG:  Muscle weakness  (generalized)  Rationale for Evaluation and Treatment: Rehabilitation  SUBJECTIVE:   SUBJECTIVE STATEMENT: She is having to see neurosurgery tomorrow due to an issue determining when she is to return to work.   She forgets to do her therapy exercises during the day. She has been able to finish the border of her puzzle. Yesterday, she was able to pay her bills online (caused increase in headache), put laundry in the washer, and vacuum her room downstairs.   Pt accompanied by: self  PERTINENT HISTORY: PMH: asthma, thyroid  disease, anemia   PRECAUTIONS: Fall  WEIGHT BEARING RESTRICTIONS: No  PAIN:  Are you having pain? Yes: NPRS scale: 2/10 Pain location: headache Pain description: ache Aggravating factors: stress Relieving factors: rest; medication  FALLS: Has patient fallen in last 6 months? Yes. Number of falls pt slipped in the tub a couple months back  LIVING ENVIRONMENT: Family/patient expects to be discharged to:: Private residence Living Arrangements: Children;Spouse/significant other Available Help at Discharge: Family;Available PRN/intermittently Type of Home: House Home Access: Stairs to enter [employer] of Steps: 3 Entrance Stairs-Rails: None Home Layout: Two level;1/2 bath on main level;Bed/bath upstairs Alternate Level Stairs-Number of Steps: flight Alternate Level Stairs-Rails: None Bathroom Shower/Tub: Engineer, manufacturing systems: Standard Home Equipment: Hand held shower head Additional Comments: pt lives with 16, 17, and 11 yo children  PLOF: Independent; working as a  lifestyle consultant; driving  PATIENT GOALS: return to PLOF  OBJECTIVE:  Note: Objective measures were completed at Evaluation unless otherwise noted.  HAND DOMINANCE: Right  ADLs: Overall ADLs: mod I  IADLs: Shopping: dependent Light housekeeping: mod I Meal Prep: dependent Community mobility: dependent Medication management: mod I Financial management: mod  I Handwriting:  no changes  MOBILITY STATUS: Independent  ACTIVITY TOLERANCE: Activity tolerance: fair  FUNCTIONAL OUTCOME MEASURES: 05/13/2023 - PSFS: 1.3 06/03/2023 - 4.3 total score   UPPER EXTREMITY ROM:    BUE: WNL  UPPER EXTREMITY MMT:     LUE is grossly 5/5; RUE is grossly 4/5  HAND FUNCTION: Grip strength: Right: 32 lbs; Left: 36 lbs  COORDINATION: 9 Hole Peg test: Right: 37 then 29 seconds sec; Left: 30 sec - therapist modified test by holding peg for pt to pick up given length of finger nails.  SENSATION: WFL  EDEMA: none reported or observed  MUSCLE TONE: BUE WNL  COGNITION: Overall cognitive status: Within functional limits for tasks assessed  VISION: Subjective report: difficulty tolerating computer use Baseline vision: Wears glasses all the time and Wears glasses for distance only Visual history: none  VISION ASSESSMENT: WFL - able to read clock on wall and paper on table  PERCEPTION: WFL  PRAXIS: WFL  OBSERVATIONS: Pt appears well-kept wearing glasses and                                                                                                            TREATMENT:   - Self-care/home management completed for duration as noted below including: OT reviewed safe strategies to obtain items from fridge, counter, oven, and cabinets by holding onto solid items for additional support. Pt was encouraged to leave pressure cooker on counter due to its weight and difficulty getting in and out of cabinets.   OT reviewed stress reduction, energy conservation, and memory strategies as noted in pt instructions as needed to improve independence and safety with ADL and IADL completion. Max cues for recall.   Therapist reviewed goals with patient and updated patient progression.  No additional functional limitations identified.  - Therapeutic exercises completed for duration as noted below including: OT reviewed RUE red Thera-Band HEP including shoulder  flexion, horizontal abd/add, bicep curl, and tricep extension to promote strengthening of affected extremity and overall endurance as noted in pt instructions. Pt requiring min cueing for proper completion.    PATIENT EDUCATION: Education details: goal progression; energy conservation; memory strategies; theraband HEP Person educated: Patient Education method: Explanation, Demonstration, Verbal cues, and Handouts Education comprehension: verbalized understanding, returned demonstration, verbal cues required, and needs further education  HOME EXERCISE PROGRAM: 05/06/2023: visual scanning activities 05/13/2023: red theraband and EC strategies 05/27/2023: memory strategies; stress reduction techniques  GOALS:  LONG TERM GOALS: Target date: 06/20/23   Patient will demonstrate updated RUE HEP with visual handouts only for proper execution. Baseline:  05/27/2023: mod cueing Goal status: MET  2.  Patient will report at least two-point increase in average PSFS score or at least three-point increase in  a single activity score indicating functionally significant improvement given minimum detectable change.  Baseline: 1.3 total score (See above for individual activity scores)  06/03/2023: 4.3 total score Goal status: MET  3.  Patient will independently verbalize at least 3 energy conservation principles in relation to ADLs to increase functional independence.  Baseline:  05/27/2023: unable to recall Goal status: IN PROGRESS  4.  Pt will demonstrate improvement to RUE MMT for shoulder flexion, abduction, elbow ext and flex.  Baseline: 4/5 Goal status: IN PROGRESS   ASSESSMENT:  CLINICAL IMPRESSION: Patient requires extensive cueing for recall of strategies as discussed during previous visits. Remains limited by activity tolerance and carryover of HEP as needed to further progress towards goals.   PERFORMANCE DEFICITS: in functional skills including ADLs, IADLs, strength, mobility, endurance,  and UE functional use.   IMPAIRMENTS: are limiting patient from ADLs, IADLs, work, and leisure.   CO-MORBIDITIES: may have co-morbidities  that affects occupational performance. Patient will benefit from skilled OT to address above impairments and improve overall function.  REHAB POTENTIAL: Good  PLAN:  OT FREQUENCY: 1x/week  OT DURATION: 6 weeks  PLANNED INTERVENTIONS: 97168 OT Re-evaluation, 97535 self care/ADL training, 25366 therapeutic exercise, 97530 therapeutic activity, 97112 neuromuscular re-education, functional mobility training, visual/perceptual remediation/compensation, energy conservation, coping strategies training, patient/family education, and DME and/or AE instructions  RECOMMENDED OTHER SERVICES: Recommend PT eval and treat given reports of leg weakness, altered, slow gait, and increased risk of falls.   CONSULTED AND AGREED WITH PLAN OF CARE: Patient  PLAN FOR NEXT SESSION: Stress reduction techniques; review WARM and energy conservation techniques   Altamease Asters, OT 06/03/2023, 11:56 AM

## 2023-06-03 ENCOUNTER — Ambulatory Visit: Admitting: Occupational Therapy

## 2023-06-03 DIAGNOSIS — S066X0A Traumatic subarachnoid hemorrhage without loss of consciousness, initial encounter: Secondary | ICD-10-CM | POA: Diagnosis not present

## 2023-06-03 DIAGNOSIS — M6281 Muscle weakness (generalized): Secondary | ICD-10-CM | POA: Diagnosis not present

## 2023-06-03 DIAGNOSIS — R531 Weakness: Secondary | ICD-10-CM | POA: Diagnosis not present

## 2023-06-03 DIAGNOSIS — R29818 Other symptoms and signs involving the nervous system: Secondary | ICD-10-CM

## 2023-06-03 DIAGNOSIS — R41844 Frontal lobe and executive function deficit: Secondary | ICD-10-CM | POA: Diagnosis not present

## 2023-06-04 DIAGNOSIS — Z6839 Body mass index (BMI) 39.0-39.9, adult: Secondary | ICD-10-CM | POA: Diagnosis not present

## 2023-06-04 DIAGNOSIS — I609 Nontraumatic subarachnoid hemorrhage, unspecified: Secondary | ICD-10-CM | POA: Diagnosis not present

## 2023-06-10 ENCOUNTER — Ambulatory Visit: Attending: Neurosurgery | Admitting: Occupational Therapy

## 2023-06-10 DIAGNOSIS — R29818 Other symptoms and signs involving the nervous system: Secondary | ICD-10-CM | POA: Diagnosis not present

## 2023-06-10 DIAGNOSIS — R41844 Frontal lobe and executive function deficit: Secondary | ICD-10-CM | POA: Diagnosis not present

## 2023-06-10 DIAGNOSIS — M6281 Muscle weakness (generalized): Secondary | ICD-10-CM | POA: Diagnosis not present

## 2023-06-10 DIAGNOSIS — R41841 Cognitive communication deficit: Secondary | ICD-10-CM | POA: Insufficient documentation

## 2023-06-10 NOTE — Therapy (Signed)
 OUTPATIENT OCCUPATIONAL THERAPY NEURO TREATMENT  Patient Name: Madeline Gomez MRN: [id number] DOB:1980/09/28, 43 y.o., female Today's Date: 06/10/2023  PCP: no PCP REFERRING PROVIDER: Augusto Blonder, MD  END OF SESSION:  OT End of Session - 06/10/23 1102     Visit Number 5    Number of Visits 7    Date for OT Re-Evaluation 06/20/23    Authorization Type [employer]    OT Start Time 1104    OT Stop Time 1146    OT Time Calculation (min) 42 min    Activity Tolerance Patient tolerated treatment well    Behavior During Therapy WFL for tasks assessed/performed            Past Medical History:  Diagnosis Date   Anemia    Asthma    Broken bones    RIGHT FOOT    BV (bacterial vaginosis)    RECURRENT   H/O vitamin D  deficiency    Thyroid  disease    Past Surgical History:  Procedure Laterality Date   CESAREAN SECTION     IR ANGIO INTRA EXTRACRAN SEL INTERNAL CAROTID BILAT MOD SED  04/15/2023   IR ANGIO INTRA EXTRACRAN SEL INTERNAL CAROTID BILAT MOD SED  04/22/2023   IR ANGIO VERTEBRAL SEL VERTEBRAL BILAT MOD SED  04/15/2023   IR ANGIO VERTEBRAL SEL VERTEBRAL BILAT MOD SED  04/22/2023   IR US  GUIDE VASC ACCESS RIGHT  04/15/2023   RADIOLOGY WITH ANESTHESIA N/A 04/15/2023   Procedure: RADIOLOGY WITH ANESTHESIA;  Surgeon: Augusto Blonder, MD;  Location: [facility] OR;  Service: Radiology;  Laterality: N/A;   WISDOM TOOTH EXTRACTION     Patient Active Problem List   Diagnosis Date Noted   Mild persistent asthma without complication 04/17/2023   Asthma, persistent controlled 04/16/2023   SAH (subarachnoid hemorrhage) (HCC) 04/14/2023   Allergic rhinitis 01/02/2015   Asthma with acute exacerbation 05/02/2014   Asthma, chronic 07/06/2012   ONSET DATE: 04/23/2023 (Date of referral)  REFERRING DIAG:  S06.6X0A (ICD-10-CM) - Subarachnoid hematoma, without loss of consciousness, initial encounter (HCC)  R53.1 (ICD-10-CM) - Weakness   THERAPY DIAG:  Muscle weakness (generalized)  Other  symptoms and signs involving the nervous system  Frontal lobe and executive function deficit  Rationale for Evaluation and Treatment: Rehabilitation  SUBJECTIVE:   SUBJECTIVE STATEMENT: She is still working on her puzzle. She has remembered WARM but cannot recall what the 4 Ps stand for.   Pt accompanied by: self  PERTINENT HISTORY: PMH: asthma, thyroid  disease, anemia   PRECAUTIONS: Fall  WEIGHT BEARING RESTRICTIONS: No  PAIN:  Are you having pain? No  FALLS: Has patient fallen in last 6 months? Yes. Number of falls pt slipped in the tub a couple months back  LIVING ENVIRONMENT: Family/patient expects to be discharged to:: Private residence Living Arrangements: Children;Spouse/significant other Available Help at Discharge: Family;Available PRN/intermittently Type of Home: House Home Access: Stairs to enter [employer] of Steps: 3 Entrance Stairs-Rails: None Home Layout: Two level;1/2 bath on main level;Bed/bath upstairs Alternate Level Stairs-Number of Steps: flight Alternate Level Stairs-Rails: None Bathroom Shower/Tub: Engineer, manufacturing systems: Standard Home Equipment: Hand held shower head Additional Comments: pt lives with 16, 17, and 66 yo children  PLOF: Independent; working as a Geophysicist/field seismologist; driving  PATIENT GOALS: return to PLOF  OBJECTIVE:  Note: Objective measures were completed at Evaluation unless otherwise noted.  HAND DOMINANCE: Right  ADLs: Overall ADLs: mod I  IADLs: Shopping: dependent Light housekeeping: mod I Meal Prep: dependent Community mobility:  dependent Medication management: mod I Financial management: mod I Handwriting: no changes  MOBILITY STATUS: Independent  ACTIVITY TOLERANCE: Activity tolerance: fair  FUNCTIONAL OUTCOME MEASURES: 05/13/2023 - PSFS: 1.3 06/03/2023 - 4.3 total score   UPPER EXTREMITY ROM:    BUE: WNL  UPPER EXTREMITY MMT:     LUE is grossly 5/5; RUE is grossly  4/5  HAND FUNCTION: Grip strength: Right: 32 lbs; Left: 36 lbs  COORDINATION: 9 Hole Peg test: Right: 37 then 29 seconds sec; Left: 30 sec - therapist modified test by holding peg for pt to pick up given length of finger nails.  SENSATION: WFL  EDEMA: none reported or observed  MUSCLE TONE: BUE WNL  COGNITION: Overall cognitive status: Within functional limits for tasks assessed  VISION: Subjective report: difficulty tolerating computer use Baseline vision: Wears glasses all the time and Wears glasses for distance only Visual history: none  VISION ASSESSMENT: WFL - able to read clock on wall and paper on table  PERCEPTION: WFL  PRAXIS: WFL  OBSERVATIONS: Pt appears well-kept wearing glasses and                                                                                                            TREATMENT:   - Self-care/home management completed for duration as noted below including: OT reviewed stress reduction, energy conservation, and memory strategies as noted in pt instructions as needed to improve independence and safety with ADL and IADL completion. Mod cues for recall.   - Therapeutic activities completed for duration as noted below including: Pt completed 24 piece puzzle for visual scanning and use of RUE. Headache reported at end of session. OT placed 6 BlazePods in front of patient (on table and mirrors) and had patient tap pods with palm of hand as pods lit up using OT Distraction and Scanning mode forfine motor coordination, gross motor coordination, pain reduction, reaction time, scanning and locating of items, processing, sequencing of unfamiliar motor movements or tasks, motor planning, item/pattern recognition, and endurance/stamina.   Hits were as follows:  RUE: 50 hits for 2 min duration and 2.2 sec reaction time  PATIENT EDUCATION: Education details: goal progression; energy conservation; memory strategies; theraband HEP Person educated:  Patient Education method: Explanation, Demonstration, Verbal cues, and Handouts Education comprehension: verbalized understanding, returned demonstration, verbal cues required, and needs further education  HOME EXERCISE PROGRAM: 05/06/2023: visual scanning activities 05/13/2023: red theraband and EC strategies 05/27/2023: memory strategies; stress reduction techniques  GOALS:  LONG TERM GOALS: Target date: 06/20/23   Patient will demonstrate updated RUE HEP with visual handouts only for proper execution. Baseline:  05/27/2023: mod cueing Goal status: MET  2.  Patient will report at least two-point increase in average PSFS score or at least three-point increase in a single activity score indicating functionally significant improvement given minimum detectable change.  Baseline: 1.3 total score (See above for individual activity scores)  06/03/2023: 4.3 total score Goal status: MET  3.  Patient will independently verbalize at least 3 energy conservation principles in relation to ADLs  to increase functional independence.  Baseline:  05/27/2023: unable to recall Goal status: IN PROGRESS  4.  Pt will demonstrate improvement to RUE MMT for shoulder flexion, abduction, elbow ext and flex.  Baseline: 4/5 Goal status: IN PROGRESS  ASSESSMENT:  CLINICAL IMPRESSION: Patient requires no cueing but increased time for recall of memory strategy; however, is unable to recall any energy conservation techniques outside of remembering there are 4 Ps. Remains limited by activity tolerance and carryover of HEP as needed to further progress towards goals.   PERFORMANCE DEFICITS: in functional skills including ADLs, IADLs, strength, mobility, endurance, and UE functional use.   IMPAIRMENTS: are limiting patient from ADLs, IADLs, work, and leisure.   CO-MORBIDITIES: may have co-morbidities  that affects occupational performance. Patient will benefit from skilled OT to address above impairments and improve  overall function.  REHAB POTENTIAL: Good  PLAN:  OT FREQUENCY: 1x/week  OT DURATION: 6 weeks  PLANNED INTERVENTIONS: 97168 OT Re-evaluation, 97535 self care/ADL training, 16109 therapeutic exercise, 97530 therapeutic activity, 97112 neuromuscular re-education, functional mobility training, visual/perceptual remediation/compensation, energy conservation, coping strategies training, patient/family education, and DME and/or AE instructions  RECOMMENDED OTHER SERVICES: Recommend PT eval and treat given reports of leg weakness, altered, slow gait, and increased risk of falls.   CONSULTED AND AGREED WITH PLAN OF CARE: Patient  PLAN FOR NEXT SESSION: Stress reduction techniques; review energy conservation techniques   Altamease Asters, OT 06/10/2023, 1:59 PM

## 2023-06-11 ENCOUNTER — Ambulatory Visit

## 2023-06-11 DIAGNOSIS — R41841 Cognitive communication deficit: Secondary | ICD-10-CM | POA: Diagnosis not present

## 2023-06-11 DIAGNOSIS — M6281 Muscle weakness (generalized): Secondary | ICD-10-CM | POA: Diagnosis not present

## 2023-06-11 DIAGNOSIS — R41844 Frontal lobe and executive function deficit: Secondary | ICD-10-CM | POA: Diagnosis not present

## 2023-06-11 DIAGNOSIS — R29818 Other symptoms and signs involving the nervous system: Secondary | ICD-10-CM | POA: Diagnosis not present

## 2023-06-11 NOTE — Patient Instructions (Signed)
 Strategies for Improving Your Attention and Memory  W - Write it down  A - Associate it with something  R - Repeat it  M - Mental Image  Use good eye-contact Give the speaker your undivided attention Look directly at the speaker  Complete one task at a time Avoid multitasking Complete one task before starting a new one Write a note to yourself if you think of something else that needs to be done Let others know when you need quiet time and can't be interrupted Don't answer the phone, texts, or emails while you are working on another task  Put aside distracting thoughts If you find your mind wandering, refocus your attention on the speaker Avoid off-topic comments or responses that may divert your attention  If something important comes to mind, let the speaker know and pause to write yourself a note: "Do you mind holding on a minute, I have to write something down." Put thoughts on hold and focus on salient information  Limit distractions in your environment Think about the environment around you Limit background noise by turning off the TV or music, putting your phone away Close the door and work in quiet  Use active listening Actively participate in the conversation to stay focused Paraphrase what you have heard to include the most important details Adding some associations may help you remember Ask questions to clarify certain points Summarize the speaker's comments periodically Avoid nodding your head and using "mhm" responses as these are more passive and don't help your attention  Alert the other person/people It may be helpful to alert your listener to the fact that you may need reminders to keep on track Tell the speaker in advance that you may need to stop them and have them repeat salient information If you lose focus, interject and let the person know, "I'm sorry, I lost you, can you tell me again?"  Write down information Write down pertinent information as  it comes up, such as telephone numbers, names of people, addresses, details from appointments and conversations, etc.

## 2023-06-11 NOTE — Therapy (Signed)
 OUTPATIENT SPEECH LANGUAGE PATHOLOGY EVALUATION   Patient Name: Madeline Gomez MRN: [id number] DOB:23-Mar-1980, 43 y.o., female Today's Date: 06/12/2023  PCP: none REFERRING PROVIDER: Augusto Blonder, MD  END OF SESSION:  End of Session - 06/12/23 1314     Visit Number 1    Number of Visits 13    Date for SLP Re-Evaluation 08/07/23   extended for scheduling   Authorization Type BCBS/Medicaid    SLP Start Time 1400    SLP Stop Time  1452    SLP Time Calculation (min) 52 min    Activity Tolerance Patient tolerated treatment well             Past Medical History:  Diagnosis Date   Anemia    Asthma    Broken bones    RIGHT FOOT    BV (bacterial vaginosis)    RECURRENT   H/O vitamin D  deficiency    Thyroid  disease    Past Surgical History:  Procedure Laterality Date   CESAREAN SECTION     IR ANGIO INTRA EXTRACRAN SEL INTERNAL CAROTID BILAT MOD SED  04/15/2023   IR ANGIO INTRA EXTRACRAN SEL INTERNAL CAROTID BILAT MOD SED  04/22/2023   IR ANGIO VERTEBRAL SEL VERTEBRAL BILAT MOD SED  04/15/2023   IR ANGIO VERTEBRAL SEL VERTEBRAL BILAT MOD SED  04/22/2023   IR US  GUIDE VASC ACCESS RIGHT  04/15/2023   RADIOLOGY WITH ANESTHESIA N/A 04/15/2023   Procedure: RADIOLOGY WITH ANESTHESIA;  Surgeon: Augusto Blonder, MD;  Location: [facility] OR;  Service: Radiology;  Laterality: N/A;   WISDOM TOOTH EXTRACTION     Patient Active Problem List   Diagnosis Date Noted   Mild persistent asthma without complication 04/17/2023   Asthma, persistent controlled 04/16/2023   SAH (subarachnoid hemorrhage) (HCC) 04/14/2023   Allergic rhinitis 01/02/2015   Asthma with acute exacerbation 05/02/2014   Asthma, chronic 07/06/2012    ONSET DATE: 06/04/2023 (referral date)   REFERRING DIAG: S06.6XAA (ICD-10-CM) - Subarachnoid hematom  THERAPY DIAG: Cognitive communication deficit  Rationale for Evaluation and Treatment: Rehabilitation  SUBJECTIVE:   SUBJECTIVE STATEMENT: "I'm having a little  difficulty with memory and attention" Pt accompanied by: self  PERTINENT HISTORY: PMH: asthma, thyroid  disease, anemia   Pt is a 43 y.o. female presenting 3/10 with frontal headache, dizziness, and nausea. 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 Now s/p radiology with anaesthesia in which no aneurysm was found. PMH significant for mild persistent asthma, thyroid  disease, anemia.   PAIN:  Are you having pain? No  FALLS: Has patient fallen in last 6 months?  Yes - slipped in bathtub a couple months ago  LIVING ENVIRONMENT: Lives with: lives with their family Lives in: House/apartment  PLOF:  Level of assistance: Independent with ADLs, Independent with IADLs Employment: Full-time employment  PATIENT GOALS: return to work   OBJECTIVE:  Note: Objective measures were completed at Evaluation unless otherwise noted.  COGNITION: Overall cognitive status: Impaired Areas of impairment:  Attention: Impaired: Sustained, Selective, Alternating, Divided Memory: Impaired: Working Teacher, music term Clinical research associate function: Impaired: Organization, Planning, and Error awareness Functional deficits: Endorsed difficulty with short term recall and attention impacting completion of daily tasks and recall of recent conversations. Concern for return to work d/t occupation required attention to detail and memorization of multiple systems/policies   COGNITIVE COMMUNICATION: Following directions: WFL  Auditory comprehension: WFL Verbal expression: WFL Functional communication: WFL  ORAL MOTOR EXAMINATION: Overall status: WFL  STANDARDIZED ASSESSMENTS: CLQT - story retell  subtest: 8/10  PATIENT REPORTED OUTCOME MEASURES (PROM): Memory Mistake Questionnaires:  31  Often: forget what you were about to do Sometimes: misplacing things, recalling name of person just met, forgetting what you were going to say in conversation, retelling same story to the same person,  forgetting details of conversations N/A: remembering phone number, leave something you meant to bring, forgetting to run errand, coming up with specific word in conversation, trouble remembering details from reading, forgetting to pass on a message, misplacing something for several days ago, forgetting to buy something you intended to buy                                                                                                                             TREATMENT DATE:  06/11/23: ST evaluation and POC complete. Initiated education and instruction of external memory compensations, including reminders app on phone to aid prospective recall. Pt had been using alarms with varied success. Able to set up reminder with rare min A. Continue to educate and train cognitive compensations in upcoming sessions.    PATIENT EDUCATION: Education details: see above Person educated: Patient Education method: Explanation Education comprehension: verbalized understanding and needs further education   GOALS: Goals reviewed with patient? Yes  SHORT TERM GOALS: Target date: 07/09/2023  Pt will teach back cognitive compensations x3 to support recall/attention  Baseline: Goal status: INITIAL  2.   Pt will recall pertinent information across therapy sessions x2 with use of memory compensations  Baseline:  Goal status: INITIAL  3.  Pt will increase active engagement in prior iADLs x2 with use of trained compensations Baseline:  Goal status: INITIAL  4.  Pt will demonstrate increased attention to detail with ability to self-correct 95% of errors on structured task  Baseline:  Goal status: INITIAL   LONG TERM GOALS: Target date: 08/07/2023  Pt will effectively participate in role play work scenario with adequate attention/recall exhibited with use of trained techniques  Baseline:  Goal status: INITIAL  2.  Pt will report less reliance on family to support cognitive functioning at home > 1 week with  use of trained techniques  Baseline:  Goal status: INITIAL  3.  Pt will report implementation of cognitive strategies x2 to support work functioning with success indicated  Baseline:  Goal status: INITIAL   ASSESSMENT:  CLINICAL IMPRESSION: Patient is a 43 y.o. F who was seen today for ST evaluation s/p stroke. 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 Reported job-related concerns d/t decline in attention to detail and memorization. Due to change in baseline, pt would benefit from skilled ST intervention to optimize cognitive functioning and QoL.   OBJECTIVE IMPAIRMENTS: include attention, memory, awareness, and executive functioning. These impairments are limiting patient from return to work and  household responsibilities.Factors affecting potential to achieve goals and functional outcome are none. Patient will benefit from skilled SLP services to address above impairments and improve overall function.  REHAB POTENTIAL: Good  PLAN:  SLP FREQUENCY: 1-2x/week  SLP DURATION: 8 weeks  PLANNED INTERVENTIONS: Environmental controls, Cueing hierachy, Cognitive reorganization, Internal/external aids, Functional tasks, SLP instruction and feedback, Compensatory strategies, Patient/family education, [phone] Treatment of speech (30 or 45 min) , and [id number]- Speech Eval Sound Prod, Artic, Phon, Eval Compre, Express    [employer], CCC-SLP 06/12/2023, 1:15 PM

## 2023-06-13 ENCOUNTER — Ambulatory Visit: Admitting: Pharmacist

## 2023-06-16 ENCOUNTER — Encounter: Payer: Self-pay | Admitting: Speech Pathology

## 2023-06-16 ENCOUNTER — Ambulatory Visit: Payer: Self-pay | Admitting: Speech Pathology

## 2023-06-16 DIAGNOSIS — R29818 Other symptoms and signs involving the nervous system: Secondary | ICD-10-CM | POA: Diagnosis not present

## 2023-06-16 DIAGNOSIS — R41841 Cognitive communication deficit: Secondary | ICD-10-CM | POA: Diagnosis not present

## 2023-06-16 DIAGNOSIS — M6281 Muscle weakness (generalized): Secondary | ICD-10-CM | POA: Diagnosis not present

## 2023-06-16 DIAGNOSIS — R41844 Frontal lobe and executive function deficit: Secondary | ICD-10-CM | POA: Diagnosis not present

## 2023-06-16 NOTE — Patient Instructions (Addendum)
   Set priorities for each week/day - write them down  Write to do list daily/weekly  Cross off the tasks that are over  Work on more complex tasks or dreaded tasks for 15-20 minutes - use a timer to get started   Accommodations to consider at work:  Reduce metrics for 1-2 months  Consider working part time for a few weeks  Take breaks in between calls  Try to do 1 task at a time, for example don't check phone, emails, texts when you are on a call  Eliminate distractions - TV, radio, phone  Reduce clutter in your office and home  Energy Conservation: Since the stroke, you have limited pennies to spend each day - you need to decide where  you are going to spend them  If you spend too many pennies in one day, you have robbed  the next day of pennies/energy  Sensory input (lights, noise, busy crowded places) also take your pennies -   Take breaks in between errands - sit in your car, quiet, eyes closed and breathe to let your brain calm down and re-set  Your stroke is invisible - you will have to remind others including family that your brain is healing and you can't do everything you did before the stroke   Tips to help facilitate better attention, concentration, focus   Do harder, longer tasks when you are most alert/awake  Break down larger tasks into small parts  Limit distractions of TV, radio, conversation, e mails/texts, appliance noise, etc - if a job is important, do it in a quiet room  Be aware of how you are functioning in high stimulation environments such as large stores, parties, restaurants - any place with lots of lights, noise, signs etc  Group conversations may be more difficult to process than one on one conversations  Give yourself extra time to process conversation, reading materials, directions or information from your healthcare providers  Organization is key - clutters of laundry, mail, paperwork, dirty dishes - all make it more difficult to  concentrate  Before you start a task, have all the needed supplies, directions, recipes ready and organized. This way you don't have to go looking for something in the middle of a task and become distracted.   Be aware of fatigue - take rests or breaks when needed to re-group and re-focus

## 2023-06-16 NOTE — Therapy (Signed)
 OUTPATIENT SPEECH LANGUAGE PATHOLOGY TREATMENT   Patient Name: Madeline Gomez MRN: [id number] DOB:02-02-1981, 43 y.o., female Today's Date: 06/16/2023  PCP: none REFERRING PROVIDER: Augusto Blonder, MD  END OF SESSION:  End of Session - 06/16/23 1320     Visit Number 2    Number of Visits 13    Date for SLP Re-Evaluation 08/07/23    Authorization Type BCBS/Medicaid VL: 30 - 5/30 used thus far    SLP Start Time 1315    SLP Stop Time  1400    SLP Time Calculation (min) 45 min    Activity Tolerance Patient tolerated treatment well             Past Medical History:  Diagnosis Date   Anemia    Asthma    Broken bones    RIGHT FOOT    BV (bacterial vaginosis)    RECURRENT   H/O vitamin D  deficiency    Thyroid  disease    Past Surgical History:  Procedure Laterality Date   CESAREAN SECTION     IR ANGIO INTRA EXTRACRAN SEL INTERNAL CAROTID BILAT MOD SED  04/15/2023   IR ANGIO INTRA EXTRACRAN SEL INTERNAL CAROTID BILAT MOD SED  04/22/2023   IR ANGIO VERTEBRAL SEL VERTEBRAL BILAT MOD SED  04/15/2023   IR ANGIO VERTEBRAL SEL VERTEBRAL BILAT MOD SED  04/22/2023   IR US  GUIDE VASC ACCESS RIGHT  04/15/2023   RADIOLOGY WITH ANESTHESIA N/A 04/15/2023   Procedure: RADIOLOGY WITH ANESTHESIA;  Surgeon: Augusto Blonder, MD;  Location: [facility] OR;  Service: Radiology;  Laterality: N/A;   WISDOM TOOTH EXTRACTION     Patient Active Problem List   Diagnosis Date Noted   Mild persistent asthma without complication 04/17/2023   Asthma, persistent controlled 04/16/2023   SAH (subarachnoid hemorrhage) (HCC) 04/14/2023   Allergic rhinitis 01/02/2015   Asthma with acute exacerbation 05/02/2014   Asthma, chronic 07/06/2012    ONSET DATE: 06/04/2023 (referral date)   REFERRING DIAG: S06.6XAA (ICD-10-CM) - Subarachnoid hematom  THERAPY DIAG: Cognitive communication deficit  Rationale for Evaluation and Treatment: Rehabilitation  SUBJECTIVE:   SUBJECTIVE STATEMENT: "I'm having a  little difficulty with memory and attention" Pt accompanied by: self  PERTINENT HISTORY: PMH: asthma, thyroid  disease, anemia   Pt is a 43 y.o. female presenting 3/10 with frontal headache, dizziness, and nausea. 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 Now s/p radiology with anaesthesia in which no aneurysm was found. PMH significant for mild persistent asthma, thyroid  disease, anemia.   PAIN:  Are you having pain? No  FALLS: Has patient fallen in last 6 months?  Yes - slipped in bathtub a couple months ago  LIVING ENVIRONMENT: Lives with: lives with their family Lives in: House/apartment  PLOF:  Level of assistance: Independent with ADLs, Independent with IADLs Employment: Full-time employment  PATIENT GOALS: return to work   OBJECTIVE:  Note: Objective measures were completed at Evaluation unless otherwise noted.  COGNITION: Overall cognitive status: Impaired Areas of impairment:  Attention: Impaired: Sustained, Selective, Alternating, Divided Memory: Impaired: Working Teacher, music term Clinical research associate function: Impaired: Organization, Planning, and Error awareness Functional deficits: Endorsed difficulty with short term recall and attention impacting completion of daily tasks and recall of recent conversations. Concern for return to work d/t occupation required attention to detail and memorization of multiple systems/policies   COGNITIVE COMMUNICATION: Following directions: WFL  Auditory comprehension: WFL Verbal expression: WFL Functional communication: WFL  ORAL MOTOR EXAMINATION: Overall status: WFL  STANDARDIZED ASSESSMENTS: CLQT -  story retell subtest: 8/10  PATIENT REPORTED OUTCOME MEASURES (PROM): Memory Mistake Questionnaires:  31  Often: forget what you were about to do Sometimes: misplacing things, recalling name of person just met, forgetting what you were going to say in conversation, retelling same story to the same  person, forgetting details of conversations N/A: remembering phone number, leave something you meant to bring, forgetting to run errand, coming up with specific word in conversation, trouble remembering details from reading, forgetting to pass on a message, misplacing something for several days ago, forgetting to buy something you intended to buy                                                                                                                             TREATMENT DATE:   06/16/23: Madeline Gomez is successfully using reminder app in conjunction with alarms on phone. She reports return to cooking and light cleaning. We generated strategies to incorporate energy conservation with memory and attention including setting weekly priorities and daily to do lists to have written goals - cross off tasks as they are completed. She identified communicating with her daughter's track coach and filling out paperwork for school system as priorities this week as well on checking on bills. We generated strategy of breaking large tasks (bill paying) into smaller 15-20 minute increments with breaks to support attention. 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 See patient instructions.   06/11/23: ST evaluation and POC complete. Initiated education and instruction of external memory compensations, including reminders app on phone to aid prospective recall. Pt had been using alarms with varied success. Able to set up reminder with rare min A. Continue to educate and train cognitive compensations in upcoming sessions.    PATIENT EDUCATION: Education details: see above Person educated: Patient Education method: Explanation Education comprehension: verbalized understanding and needs further education   GOALS: Goals reviewed with patient? Yes  SHORT TERM GOALS: Target date: 07/09/2023  Pt  will teach back cognitive compensations x3 to support recall/attention  Baseline: Goal status: INITIAL  2.   Pt will recall pertinent information across therapy sessions x2 with use of memory compensations  Baseline:  Goal status: INITIAL  3.  Pt will increase active engagement in prior iADLs x2 with use of trained compensations Baseline:  Goal status: INITIAL  4.  Pt will demonstrate increased attention to detail with ability to self-correct 95% of errors on structured task  Baseline:  Goal status: INITIAL   LONG TERM GOALS: Target date: 08/07/2023  Pt will effectively participate in role play work scenario with adequate attention/recall exhibited with use of trained techniques  Baseline:  Goal status: INITIAL  2.  Pt will report less reliance on family to support cognitive functioning at home > 1 week with use of trained techniques  Baseline:  Goal status: INITIAL  3.  Pt will  report implementation of cognitive strategies x2 to support work functioning with success indicated  Baseline:  Goal status: INITIAL   ASSESSMENT:  CLINICAL IMPRESSION: Patient is a 43 y.o. F who was seen today for ST evaluation s/p stroke. 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 Reported job-related concerns d/t decline in attention to detail and memorization. Due to change in baseline, pt would benefit from skilled ST intervention to optimize cognitive functioning and QoL.   OBJECTIVE IMPAIRMENTS: include attention, memory, awareness, and executive functioning. These impairments are limiting patient from return to work and household responsibilities.Factors affecting potential to achieve goals and functional outcome are none.  Patient will benefit from skilled SLP services to address above impairments and improve overall function.  REHAB POTENTIAL: Good  PLAN:  SLP FREQUENCY: 1-2x/week  SLP DURATION: 8 weeks  PLANNED INTERVENTIONS: Environmental controls, Cueing hierachy, Cognitive reorganization, Internal/external aids, Functional tasks, SLP instruction and feedback, Compensatory strategies, Patient/family education, [phone] Treatment of speech (30 or 45 min) , and [id number]- Speech Eval Sound Prod, Artic, Phon, Eval Compre, Express    Kylil Swopes, Dareen Ebbing, CCC-SLP 06/16/2023, 2:07 PM

## 2023-06-17 ENCOUNTER — Ambulatory Visit: Admitting: Occupational Therapy

## 2023-06-17 DIAGNOSIS — M6281 Muscle weakness (generalized): Secondary | ICD-10-CM | POA: Diagnosis not present

## 2023-06-17 DIAGNOSIS — R29818 Other symptoms and signs involving the nervous system: Secondary | ICD-10-CM | POA: Diagnosis not present

## 2023-06-17 DIAGNOSIS — R41844 Frontal lobe and executive function deficit: Secondary | ICD-10-CM

## 2023-06-17 DIAGNOSIS — R41841 Cognitive communication deficit: Secondary | ICD-10-CM | POA: Diagnosis not present

## 2023-06-17 NOTE — Therapy (Signed)
 OUTPATIENT OCCUPATIONAL THERAPY NEURO TREATMENT & PROGRESS NOTE  Patient Name: Madeline Gomez MRN: [id number] DOB:04/20/1980, 43 y.o., female Today's Date: 06/17/2023  PCP: no PCP REFERRING PROVIDER: Augusto Blonder, MD  END OF SESSION:  OT End of Session - 06/17/23 0940     Visit Number 6    Number of Visits 7    Date for OT Re-Evaluation 06/20/23    Authorization Type BCBS    OT Start Time [phone]    OT Stop Time 1015    OT Time Calculation (min) 39 min    Activity Tolerance Patient tolerated treatment well    Behavior During Therapy WFL for tasks assessed/performed            Past Medical History:  Diagnosis Date   Anemia    Asthma    Broken bones    RIGHT FOOT    BV (bacterial vaginosis)    RECURRENT   H/O vitamin D  deficiency    Thyroid  disease    Past Surgical History:  Procedure Laterality Date   CESAREAN SECTION     IR ANGIO INTRA EXTRACRAN SEL INTERNAL CAROTID BILAT MOD SED  04/15/2023   IR ANGIO INTRA EXTRACRAN SEL INTERNAL CAROTID BILAT MOD SED  04/22/2023   IR ANGIO VERTEBRAL SEL VERTEBRAL BILAT MOD SED  04/15/2023   IR ANGIO VERTEBRAL SEL VERTEBRAL BILAT MOD SED  04/22/2023   IR US  GUIDE VASC ACCESS RIGHT  04/15/2023   RADIOLOGY WITH ANESTHESIA N/A 04/15/2023   Procedure: RADIOLOGY WITH ANESTHESIA;  Surgeon: Augusto Blonder, MD;  Location: [facility] OR;  Service: Radiology;  Laterality: N/A;   WISDOM TOOTH EXTRACTION     Patient Active Problem List   Diagnosis Date Noted   Mild persistent asthma without complication 04/17/2023   Asthma, persistent controlled 04/16/2023   SAH (subarachnoid hemorrhage) (HCC) 04/14/2023   Allergic rhinitis 01/02/2015   Asthma with acute exacerbation 05/02/2014   Asthma, chronic 07/06/2012   ONSET DATE: 04/23/2023 (Date of referral)  REFERRING DIAG:  S06.6X0A (ICD-10-CM) - Subarachnoid hematoma, without loss of consciousness, initial encounter (HCC)  R53.1 (ICD-10-CM) - Weakness   THERAPY DIAG:  Muscle weakness  (generalized)  Other symptoms and signs involving the nervous system  Frontal lobe and executive function deficit  Rationale for Evaluation and Treatment: Rehabilitation  SUBJECTIVE:   SUBJECTIVE STATEMENT: She went to [employer], and [employer] in one day and was so fatigued, she couldn't do much for 2 days.   Pt accompanied by: self  PERTINENT HISTORY: PMH: asthma, thyroid  disease, anemia   PRECAUTIONS: Fall  WEIGHT BEARING RESTRICTIONS: No  PAIN:  Are you having pain? Yes: NPRS scale: 1/10 Pain location: headache Pain description: "I know it's there" Aggravating factors: stress, sodium  FALLS: Has patient fallen in last 6 months? Yes. Number of falls pt slipped in the tub a couple months back  LIVING ENVIRONMENT: Family/patient expects to be discharged to:: Private residence Living Arrangements: Children;Spouse/significant other Available Help at Discharge: Family;Available PRN/intermittently Type of Home: House Home Access: Stairs to enter [employer] of Steps: 3 Entrance Stairs-Rails: None Home Layout: Two level;1/2 bath on main level;Bed/bath upstairs Alternate Level Stairs-Number of Steps: flight Alternate Level Stairs-Rails: None Bathroom Shower/Tub: Engineer, manufacturing systems: Standard Home Equipment: Hand held shower head Additional Comments: pt lives with 16, 17, and 23 yo children  PLOF: Independent; working as a Geophysicist/field seismologist; driving  PATIENT GOALS: return to PLOF  OBJECTIVE:  Note: Objective measures were completed at Evaluation unless otherwise noted.  HAND  DOMINANCE: Right  ADLs: Overall ADLs: mod I  IADLs: Shopping: dependent Light housekeeping: mod I Meal Prep: dependent Community mobility: dependent Medication management: mod I Financial management: mod I Handwriting: no changes  MOBILITY STATUS: Independent  ACTIVITY TOLERANCE: Activity tolerance: fair  FUNCTIONAL OUTCOME MEASURES: 05/13/2023 - PSFS:  1.3 06/03/2023 - 4.3 total score 06/17/2023: 5.0 total score   UPPER EXTREMITY ROM:    BUE: WNL  UPPER EXTREMITY MMT:     LUE is grossly 5/5; RUE is grossly 4/5  HAND FUNCTION: Grip strength: Right: 32 lbs; Left: 36 lbs  COORDINATION: 9 Hole Peg test: Right: 37 then 29 seconds sec; Left: 30 sec - therapist modified test by holding peg for pt to pick up given length of finger nails. 06/17/2023: R - 22 seconds  SENSATION: WFL  EDEMA: none reported or observed  MUSCLE TONE: BUE WNL  COGNITION: Overall cognitive status: Within functional limits for tasks assessed  VISION: Subjective report: difficulty tolerating computer use Baseline vision: Wears glasses all the time and Wears glasses for distance only Visual history: none  VISION ASSESSMENT: WFL - able to read clock on wall and paper on table  PERCEPTION: WFL  PRAXIS: WFL  OBSERVATIONS: Pt appears well-kept wearing glasses and                                                                                                            TREATMENT:   - Self-care/home management completed for duration as noted below including: OT reviewed stress reduction and energy conservation strategies as noted in pt instructions as needed to improve independence and safety with ADL and IADL completion. Mod cues for recall.   Objective measures assessed as noted in Goals section to determine progression towards goals. Therapist reviewed goals with patient and updated patient progression.  No additional functional limitations identified.  PATIENT EDUCATION: Education details: goal progression; energy conservation; updated POC Person educated: Patient Education method: Explanation, Demonstration, and Verbal cues Education comprehension: verbalized understanding, returned demonstration, verbal cues required, and needs further education  HOME EXERCISE PROGRAM: 05/06/2023: visual scanning activities 05/13/2023: red theraband and EC  strategies 05/27/2023: memory strategies; stress reduction techniques  GOALS:  LONG TERM GOALS: Target date: 07/11/2023  Patient will demonstrate updated RUE HEP with visual handouts only for proper execution. Baseline:  05/27/2023: mod cueing Goal status: MET  2.  Patient will report at least two-point increase in average PSFS score or at least three-point increase in a single activity score indicating functionally significant improvement given minimum detectable change.  Baseline: 1.3 total score (See above for individual activity scores)  06/03/2023: 4.3 total score Goal status: MET  3.  Patient will independently verbalize at least 3 energy conservation principles in relation to ADLs to increase functional independence.  Baseline:  05/27/2023: unable to recall 06/17/2023: recalled 2 Goal status: IN PROGRESS  4.  Pt will demonstrate improvement to RUE MMT for shoulder flexion, abduction, elbow ext and flex.  Baseline: 4/5 Goal status: MET  ASSESSMENT:  CLINICAL IMPRESSION: Patient has not progressed towards goals as  quickly as expected. This seems to in part be due to cognitive impairments and lack of carryover. She does not seem to have improved her ability to manage stress, which is concerning given her stressors at work and home. Without change, this puts her at a higher risk of a secondary CVA or other medical issues. These concerns were discussed at length with pt and advise additional visit in 3 weeks. In the meantime, the pt was encouraged to establish PCP and continue with HEP and other strategies.   PERFORMANCE DEFICITS: in functional skills including ADLs, IADLs, strength, mobility, endurance, and UE functional use.   IMPAIRMENTS: are limiting patient from ADLs, IADLs, work, and leisure.   CO-MORBIDITIES: may have co-morbidities  that affects occupational performance. Patient will benefit from skilled OT to address above impairments and improve overall function.  REHAB  POTENTIAL: Good  PLAN:  OT FREQUENCY: 1x/week ADDITIONAL: 1 visit in 3 weeks  OT DURATION: 6 weeks  UPDATED: will see pt in 3 weeks to review strategies to manage stress and manage overall health and well-being as she plans to return to work the middle of June.   PLANNED INTERVENTIONS: 97168 OT Re-evaluation, 97535 self care/ADL training, 16109 therapeutic exercise, 97530 therapeutic activity, 97112 neuromuscular re-education, functional mobility training, visual/perceptual remediation/compensation, energy conservation, coping strategies training, patient/family education, and DME and/or AE instructions  RECOMMENDED OTHER SERVICES: Recommend PT eval and treat given reports of leg weakness, altered, slow gait, and increased risk of falls.   CONSULTED AND AGREED WITH PLAN OF CARE: Patient  PLAN FOR NEXT SESSION: Review goals   Altamease Asters, OT 06/17/2023, 11:01 AM

## 2023-06-18 ENCOUNTER — Ambulatory Visit: Payer: Self-pay

## 2023-06-18 DIAGNOSIS — R41844 Frontal lobe and executive function deficit: Secondary | ICD-10-CM | POA: Diagnosis not present

## 2023-06-18 DIAGNOSIS — R29818 Other symptoms and signs involving the nervous system: Secondary | ICD-10-CM | POA: Diagnosis not present

## 2023-06-18 DIAGNOSIS — R41841 Cognitive communication deficit: Secondary | ICD-10-CM

## 2023-06-18 DIAGNOSIS — M6281 Muscle weakness (generalized): Secondary | ICD-10-CM | POA: Diagnosis not present

## 2023-06-18 NOTE — Patient Instructions (Addendum)
 SMART Goals Specific: Clear and unambiguous, defining exactly what you want to achieve. Measurable: Allows you to track progress and determine if the goal has been met. Achievable: Realistic and attainable within your capabilities and resources. Relevant: Aligned with your overall goals and priorities. Time-bound: Set a clear deadline or timeframe for completion  Recommendations:  Write a to-do list Keep it short, realistic, and achievable (see above) If you don't accomplish something, move it to the next day Check off as you go  Write notes during phone calls Be specific when you take notes  Ask for more time if needed

## 2023-06-18 NOTE — Therapy (Signed)
 OUTPATIENT SPEECH LANGUAGE PATHOLOGY TREATMENT   Patient Name: Madeline Gomez MRN: [id number] DOB:29-Aug-1980, 43 y.o., female Today's Date: 06/18/2023  PCP: none REFERRING PROVIDER: Augusto Blonder, MD  END OF SESSION:  End of Session - 06/18/23 1015     Visit Number 3    Number of Visits 13    Date for SLP Re-Evaluation 08/07/23    Authorization Type BCBS/Medicaid VL: 30 - 5/30 used thus far    SLP Start Time 1015    SLP Stop Time  1100    SLP Time Calculation (min) 45 min    Activity Tolerance Patient tolerated treatment well              Past Medical History:  Diagnosis Date   Anemia    Asthma    Broken bones    RIGHT FOOT    BV (bacterial vaginosis)    RECURRENT   H/O vitamin D  deficiency    Thyroid  disease    Past Surgical History:  Procedure Laterality Date   CESAREAN SECTION     IR ANGIO INTRA EXTRACRAN SEL INTERNAL CAROTID BILAT MOD SED  04/15/2023   IR ANGIO INTRA EXTRACRAN SEL INTERNAL CAROTID BILAT MOD SED  04/22/2023   IR ANGIO VERTEBRAL SEL VERTEBRAL BILAT MOD SED  04/15/2023   IR ANGIO VERTEBRAL SEL VERTEBRAL BILAT MOD SED  04/22/2023   IR US  GUIDE VASC ACCESS RIGHT  04/15/2023   RADIOLOGY WITH ANESTHESIA N/A 04/15/2023   Procedure: RADIOLOGY WITH ANESTHESIA;  Surgeon: Augusto Blonder, MD;  Location: [facility] OR;  Service: Radiology;  Laterality: N/A;   WISDOM TOOTH EXTRACTION     Patient Active Problem List   Diagnosis Date Noted   Mild persistent asthma without complication 04/17/2023   Asthma, persistent controlled 04/16/2023   SAH (subarachnoid hemorrhage) (HCC) 04/14/2023   Allergic rhinitis 01/02/2015   Asthma with acute exacerbation 05/02/2014   Asthma, chronic 07/06/2012    ONSET DATE: 06/04/2023 (referral date)   REFERRING DIAG: S06.6XAA (ICD-10-CM) - Subarachnoid hematom  THERAPY DIAG: Cognitive communication deficit  Rationale for Evaluation and Treatment: Rehabilitation  SUBJECTIVE:   SUBJECTIVE STATEMENT: " Pt accompanied  by: self  PERTINENT HISTORY: PMH: asthma, thyroid  disease, anemia   Pt is a 43 y.o. female presenting 3/10 with frontal headache, dizziness, and nausea. 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 Now s/p radiology with anaesthesia in which no aneurysm was found. PMH significant for mild persistent asthma, thyroid  disease, anemia.   PAIN:  Are you having pain? No  FALLS: Has patient fallen in last 6 months?  Yes - slipped in bathtub a couple months ago  LIVING ENVIRONMENT: Lives with: lives with their family Lives in: House/apartment  PLOF:  Level of assistance: Independent with ADLs, Independent with IADLs Employment: Full-time employment  PATIENT GOALS: return to work   OBJECTIVE:  Note: Objective measures were completed at Evaluation unless otherwise noted.  TREATMENT DATE:  06/18/23: Endorsed some frustration during bill management this morning d/t overwhelm and need for prioritization. SLP initiated use of written organizer for bill pay management and to support cognitive functioning. Provided recommendations to manage to-do list and written organizers to reduce cognitive overload, see pt instructions. Reportedly forgot to call company yesterday, in which pt self-implemented use of reminder app to support prospective recall. Recommended writing detailed notes/comments during phone calls to support short term recall.   06/16/23: Madeline Gomez is successfully using reminder app in conjunction with alarms on phone. She reports return to cooking and light cleaning. We generated strategies to incorporate energy conservation with memory and attention including setting weekly priorities and daily to do lists to have written goals - cross off tasks as they are completed. She identified communicating with her daughter's track coach and filling out  paperwork for school system as priorities this week as well on checking on bills. We generated strategy of breaking large tasks (bill paying) into smaller 15-20 minute increments with breaks to support attention. 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 See patient instructions.   06/11/23: ST evaluation and POC complete. Initiated education and instruction of external memory compensations, including reminders app on phone to aid prospective recall. Pt had been using alarms with varied success. Able to set up reminder with rare min A. Continue to educate and train cognitive compensations in upcoming sessions.    PATIENT EDUCATION: Education details: see above Person educated: Patient Education method: Explanation Education comprehension: verbalized understanding and needs further education   GOALS: Goals reviewed with patient? Yes  SHORT TERM GOALS: Target date: 07/09/2023  Pt will teach back cognitive compensations x3 to support recall/attention  Baseline:  Goal status: ONGOING  2.   Pt will recall pertinent information across therapy sessions x2 with use of memory compensations  Baseline: 06/18/23 Goal status: ONGOING  3.  Pt will increase active engagement in prior iADLs x2 with use of trained compensations Baseline:  Goal status: ONGOING  4.  Pt will demonstrate increased attention to detail with ability to self-correct 95% of errors on structured task  Baseline:  Goal status: ONGOING   LONG TERM GOALS: Target date: 08/07/2023  Pt will effectively participate in role play work scenario with adequate attention/recall exhibited with use of trained techniques  Baseline:  Goal status: ONGOING  2.  Pt will report less reliance on family to support cognitive functioning at home > 1 week with use of trained techniques  Baseline:  Goal status: ONGOING  3.  Pt  will report implementation of cognitive strategies x2 to support work functioning with success indicated  Baseline:  Goal status: ONGOING   ASSESSMENT:  CLINICAL IMPRESSION: Patient is a 43 y.o. F who was seen today for ST tx s/p stroke. 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 Reported job-related concerns d/t decline in attention to detail and memorization. Continued education and instruction of cognitive compensations to optimize daily functioning, with good carryover exhibited. Due to change in baseline, pt would benefit from skilled ST intervention to optimize cognitive functioning and QoL.   OBJECTIVE IMPAIRMENTS: include attention, memory, awareness, and executive functioning. These impairments are limiting patient from return to work and  household responsibilities.Factors affecting potential to achieve goals and functional outcome are none. Patient will benefit from skilled SLP services to address above impairments and improve overall function.  REHAB POTENTIAL: Good  PLAN:  SLP FREQUENCY: 1-2x/week  SLP DURATION: 8 weeks  PLANNED INTERVENTIONS: Environmental controls, Cueing hierachy, Cognitive reorganization, Internal/external aids, Functional tasks, SLP instruction and feedback, Compensatory strategies, Patient/family education, [phone] Treatment of speech (30 or 45 min) , and [id number]- Speech Eval Sound Prod, Artic, Phon, Eval Compre, Express    [employer], CCC-SLP 06/18/2023, 11:03 AM

## 2023-06-24 DIAGNOSIS — J3089 Other allergic rhinitis: Secondary | ICD-10-CM | POA: Diagnosis not present

## 2023-06-24 DIAGNOSIS — J4520 Mild intermittent asthma, uncomplicated: Secondary | ICD-10-CM | POA: Diagnosis not present

## 2023-06-24 DIAGNOSIS — J301 Allergic rhinitis due to pollen: Secondary | ICD-10-CM | POA: Diagnosis not present

## 2023-06-24 DIAGNOSIS — J45998 Other asthma: Secondary | ICD-10-CM | POA: Diagnosis not present

## 2023-06-24 DIAGNOSIS — J45909 Unspecified asthma, uncomplicated: Secondary | ICD-10-CM | POA: Diagnosis not present

## 2023-06-24 DIAGNOSIS — J3081 Allergic rhinitis due to animal (cat) (dog) hair and dander: Secondary | ICD-10-CM | POA: Diagnosis not present

## 2023-07-08 NOTE — Therapy (Unsigned)
 OUTPATIENT SPEECH LANGUAGE PATHOLOGY TREATMENT   Patient Name: Madeline Gomez MRN: [id number] DOB:10-20-1980, 43 y.o., female Today's Date: 07/09/2023  PCP: none REFERRING PROVIDER: Augusto Blonder, MD  END OF SESSION:  End of Session - 07/09/23 1152     Visit Number 4    Number of Visits 13    Date for SLP Re-Evaluation 08/07/23    Authorization Type BCBS/Medicaid VL: 30 - 5/30 used at time of eval    SLP Start Time 1152    SLP Stop Time  1230    SLP Time Calculation (min) 38 min    Activity Tolerance Patient tolerated treatment well               Past Medical History:  Diagnosis Date   Anemia    Asthma    Broken bones    RIGHT FOOT    BV (bacterial vaginosis)    RECURRENT   H/O vitamin D  deficiency    Thyroid  disease    Past Surgical History:  Procedure Laterality Date   CESAREAN SECTION     IR ANGIO INTRA EXTRACRAN SEL INTERNAL CAROTID BILAT MOD SED  04/15/2023   IR ANGIO INTRA EXTRACRAN SEL INTERNAL CAROTID BILAT MOD SED  04/22/2023   IR ANGIO VERTEBRAL SEL VERTEBRAL BILAT MOD SED  04/15/2023   IR ANGIO VERTEBRAL SEL VERTEBRAL BILAT MOD SED  04/22/2023   IR US  GUIDE VASC ACCESS RIGHT  04/15/2023   RADIOLOGY WITH ANESTHESIA N/A 04/15/2023   Procedure: RADIOLOGY WITH ANESTHESIA;  Surgeon: Augusto Blonder, MD;  Location: [facility] OR;  Service: Radiology;  Laterality: N/A;   WISDOM TOOTH EXTRACTION     Patient Active Problem List   Diagnosis Date Noted   Mild persistent asthma without complication 04/17/2023   Asthma, persistent controlled 04/16/2023   SAH (subarachnoid hemorrhage) (HCC) 04/14/2023   Allergic rhinitis 01/02/2015   Asthma with acute exacerbation 05/02/2014   Asthma, chronic 07/06/2012    ONSET DATE: 06/04/2023 (referral date)   REFERRING DIAG: S06.6XAA (ICD-10-CM) - Subarachnoid hematom  THERAPY DIAG: Cognitive communication deficit  Rationale for Evaluation and Treatment: Rehabilitation  SUBJECTIVE:   SUBJECTIVE STATEMENT: "I'm  pooped" Pt accompanied by: self  PERTINENT HISTORY: PMH: asthma, thyroid  disease, anemia   Pt is a 43 y.o. female presenting 3/10 with frontal headache, dizziness, and nausea. 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 Now s/p radiology with anaesthesia in which no aneurysm was found. PMH significant for mild persistent asthma, thyroid  disease, anemia.   PAIN:  Are you having pain? No  FALLS: Has patient fallen in last 6 months?  Yes - slipped in bathtub a couple months ago  LIVING ENVIRONMENT: Lives with: lives with their family Lives in: House/apartment  PLOF:  Level of assistance: Independent with ADLs, Independent with IADLs Employment: Full-time employment  PATIENT GOALS: return to work   OBJECTIVE:  Note: Objective measures were completed at Evaluation unless otherwise noted.  TREATMENT DATE:  07/08/23: Reported busy day yesterday which was physically and cognitive fatiguing. Indicated frustration related to busy schedule but frustration not correlated to cognitive functioning. Endorsed cognition is "better" but reported difficulty with recalling items when transferring locations. Recommended verbal coaching, internal repetition, and/or mental picture to assist with encoding information and accuracy of recall. Discussed possible accommodations for return to work to support cognitive functioning, see pt instructions. Endorsed return to prior iADLs x3 with success. Additional recommendations of external compensations to support cognition also in pt instructions. Pt verbalized understanding.   06/18/23: Endorsed some frustration during bill management this morning d/t overwhelm and need for prioritization. SLP initiated use of written organizer for bill pay management and to support cognitive functioning. Provided recommendations to  manage to-do list and written organizers to reduce cognitive overload, see pt instructions. Reportedly forgot to call company yesterday, in which pt self-implemented use of reminder app to support prospective recall. Recommended writing detailed notes/comments during phone calls to support short term recall.   06/16/23: Kalynn is successfully using reminder app in conjunction with alarms on phone. She reports return to cooking and light cleaning. We generated strategies to incorporate energy conservation with memory and attention including setting weekly priorities and daily to do lists to have written goals - cross off tasks as they are completed. She identified communicating with her daughter's track coach and filling out paperwork for school system as priorities this week as well on checking on bills. We generated strategy of breaking large tasks (bill paying) into smaller 15-20 minute increments with breaks to support attention. 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 See patient instructions.   06/11/23: ST evaluation and POC complete. Initiated education and instruction of external memory compensations, including reminders app on phone to aid prospective recall. Pt had been using alarms with varied success. Able to set up reminder with rare min A. Continue to educate and train cognitive compensations in upcoming sessions.    PATIENT EDUCATION: Education details: see above Person educated: Patient Education method: Explanation Education comprehension: verbalized understanding and needs further education   GOALS: Goals reviewed with patient? Yes  SHORT TERM GOALS: Target date: 07/09/2023  Pt will teach back cognitive compensations x3 to support recall/attention  Baseline:  Goal status: PARTIALLY MET  2.   Pt will recall pertinent information across therapy sessions x2  with use of memory compensations  Baseline: 06/18/23, 07/09/23 Goal status: MET  3.  Pt will increase active engagement in prior iADLs x2 with use of trained compensations Baseline:  Goal status: MET  4.  Pt will demonstrate increased attention to detail with ability to self-correct 95% of errors on structured task  Baseline:  Goal status: PARTIALLY MET    LONG TERM GOALS: Target date: 08/07/2023  Pt will effectively participate in role play work scenario with adequate attention/recall exhibited with use of trained techniques  Baseline:  Goal status: ONGOING  2.  Pt will report less reliance on family to support cognitive functioning at home > 1 week with use of trained techniques  Baseline:  Goal status: ONGOING  3.  Pt will report implementation of cognitive strategies x2 to support work functioning with success indicated  Baseline:  Goal status: ONGOING   ASSESSMENT:  CLINICAL IMPRESSION: Patient is a 43 y.o. F who was seen today for ST tx s/p stroke. Pt presents with mild cognitive linguistic deficits related  to short term recall, attention, and executive functioning. 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 Reported job-related concerns d/t decline in attention to detail and memorization. Continued education and instruction of cognitive compensations to optimize daily functioning, with good carryover exhibited. Due to change in baseline, pt would benefit from skilled ST intervention to optimize cognitive functioning and QoL.   OBJECTIVE IMPAIRMENTS: include attention, memory, awareness, and executive functioning. These impairments are limiting patient from return to work and household responsibilities.Factors affecting potential to achieve goals and functional outcome are none. Patient will benefit from skilled SLP services  to address above impairments and improve overall function.  REHAB POTENTIAL: Good  PLAN:  SLP FREQUENCY: 1-2x/week  SLP DURATION: 8 weeks  PLANNED INTERVENTIONS: Environmental controls, Cueing hierachy, Cognitive reorganization, Internal/external aids, Functional tasks, SLP instruction and feedback, Compensatory strategies, Patient/family education, [phone] Treatment of speech (30 or 45 min) , and [id number]- Speech Eval Sound Prod, Artic, Phon, Eval Compre, Express    [employer], CCC-SLP 07/09/2023, 11:53 AM

## 2023-07-09 ENCOUNTER — Ambulatory Visit: Payer: Self-pay

## 2023-07-09 ENCOUNTER — Ambulatory Visit: Payer: Self-pay | Attending: Neurosurgery | Admitting: Occupational Therapy

## 2023-07-09 DIAGNOSIS — R29818 Other symptoms and signs involving the nervous system: Secondary | ICD-10-CM | POA: Diagnosis not present

## 2023-07-09 DIAGNOSIS — R41844 Frontal lobe and executive function deficit: Secondary | ICD-10-CM | POA: Diagnosis not present

## 2023-07-09 DIAGNOSIS — M6281 Muscle weakness (generalized): Secondary | ICD-10-CM | POA: Insufficient documentation

## 2023-07-09 DIAGNOSIS — R41841 Cognitive communication deficit: Secondary | ICD-10-CM

## 2023-07-09 NOTE — Therapy (Signed)
 OUTPATIENT OCCUPATIONAL THERAPY NEURO TREATMENT & DISCHARGE  Patient Name: DASHAY GIESLER MRN: [id number] DOB:[birth year], 43 y.o., female Today's Date: 07/09/2023  OCCUPATIONAL THERAPY DISCHARGE SUMMARY  Visits from Start of Care: 7  Current functional level related to goals / functional outcomes: Patient has met all short and long-term goals to date.   Remaining deficits: Pt limited by cognition (though improved) and stress management   Education / Equipment: Continue with remediation efforts to maximize recovery and prevent further complications as able   Patient agrees to discharge. Patient goals were met. Patient is being discharged due to meeting the stated rehab goals.Aaron Aas   PCP: no PCP REFERRING PROVIDER: Augusto Blonder, MD  END OF SESSION:  OT End of Session - 07/09/23 1232     Visit Number 7    Number of Visits 7    Date for OT Re-Evaluation 07/11/23    Authorization Type BCBS    OT Start Time 1232    OT Stop Time 1307    OT Time Calculation (min) 35 min    Activity Tolerance Patient tolerated treatment well    Behavior During Therapy WFL for tasks assessed/performed            Past Medical History:  Diagnosis Date   Anemia    Asthma    Broken bones    RIGHT FOOT    BV (bacterial vaginosis)    RECURRENT   H/O vitamin D  deficiency    Thyroid  disease    Past Surgical History:  Procedure Laterality Date   CESAREAN SECTION     IR ANGIO INTRA EXTRACRAN SEL INTERNAL CAROTID BILAT MOD SED  04/15/2023   IR ANGIO INTRA EXTRACRAN SEL INTERNAL CAROTID BILAT MOD SED  04/22/2023   IR ANGIO VERTEBRAL SEL VERTEBRAL BILAT MOD SED  04/15/2023   IR ANGIO VERTEBRAL SEL VERTEBRAL BILAT MOD SED  04/22/2023   IR US  GUIDE VASC ACCESS RIGHT  04/15/2023   RADIOLOGY WITH ANESTHESIA N/A 04/15/2023   Procedure: RADIOLOGY WITH ANESTHESIA;  Surgeon: Augusto Blonder, MD;  Location: [facility] OR;  Service: Radiology;  Laterality: N/A;   WISDOM TOOTH EXTRACTION     Patient Active  Problem List   Diagnosis Date Noted   Mild persistent asthma without complication 04/17/2023   Asthma, persistent controlled 04/16/2023   SAH (subarachnoid hemorrhage) (HCC) 04/14/2023   Allergic rhinitis 01/02/2015   Asthma with acute exacerbation 05/02/2014   Asthma, chronic 07/06/2012   ONSET DATE: 04/23/2023 (Date of referral)  REFERRING DIAG:  S06.6X0A (ICD-10-CM) - Subarachnoid hematoma, without loss of consciousness, initial encounter (HCC)  R53.1 (ICD-10-CM) - Weakness   THERAPY DIAG:  Muscle weakness (generalized)  Other symptoms and signs involving the nervous system  Frontal lobe and executive function deficit  Rationale for Evaluation and Treatment: Rehabilitation  SUBJECTIVE:   SUBJECTIVE STATEMENT: She has been doing word searches and has been working on her puzzle at home (not yet completed). Reports being able to manage schedules better and tolerate stimulating environments better than a month ago. No difficulty with cooking or getting items from the bottom drawer.   Pt accompanied by: self  PERTINENT HISTORY: PMH: asthma, thyroid  disease, anemia   PRECAUTIONS: Fall  WEIGHT BEARING RESTRICTIONS: No  PAIN:  Are you having pain? No  FALLS: Has patient fallen in last 6 months? Yes. Number of falls pt slipped in the tub a couple months back  LIVING ENVIRONMENT: Family/patient expects to be discharged to:: Private residence Living Arrangements: Children;Spouse/significant other Available Help at Discharge: Family;Available  PRN/intermittently Type of Home: House Home Access: Stairs to enter [employer] of Steps: 3 Entrance Stairs-Rails: None Home Layout: Two level;1/2 bath on main level;Bed/bath upstairs Alternate Level Stairs-Number of Steps: flight Alternate Level Stairs-Rails: None Bathroom Shower/Tub: Engineer, manufacturing systems: Standard Home Equipment: Hand held shower head Additional Comments: pt lives with 16, 17, and 106 yo  children  PLOF: Independent; working as a Geophysicist/field seismologist; driving  PATIENT GOALS: return to PLOF  OBJECTIVE:  Note: Objective measures were completed at Evaluation unless otherwise noted.  HAND DOMINANCE: Right  ADLs: Overall ADLs: mod I  IADLs: Shopping: dependent Light housekeeping: mod I Meal Prep: dependent Community mobility: dependent Medication management: mod I Financial management: mod I Handwriting: no changes  MOBILITY STATUS: Independent  ACTIVITY TOLERANCE: Activity tolerance: fair  FUNCTIONAL OUTCOME MEASURES: 05/13/2023 - PSFS: 1.3 06/03/2023 - 4.3 total score 06/17/2023: 5.0 total score   UPPER EXTREMITY ROM:    BUE: WNL  UPPER EXTREMITY MMT:     LUE is grossly 5/5; RUE is grossly 4/5  HAND FUNCTION: Grip strength: Right: 32 lbs; Left: 36 lbs  COORDINATION: 9 Hole Peg test: Right: 37 then 29 seconds sec; Left: 30 sec - therapist modified test by holding peg for pt to pick up given length of finger nails. 06/17/2023: R - 22 seconds  SENSATION: WFL  EDEMA: none reported or observed  MUSCLE TONE: BUE WNL  COGNITION: Overall cognitive status: Within functional limits for tasks assessed  VISION: Subjective report: difficulty tolerating computer use Baseline vision: Wears glasses all the time and Wears glasses for distance only Visual history: none  VISION ASSESSMENT: WFL - able to read clock on wall and paper on table  PERCEPTION: WFL  PRAXIS: WFL  OBSERVATIONS: Pt appears well-kept wearing glasses and                                                                                                            TREATMENT:   - Self-care/home management completed for duration as noted below including: Objective measures assessed as noted in Goals section to determine progression towards goals. Therapist reviewed goals with patient and updated patient progression.  No additional functional limitations identified.  PATIENT  EDUCATION: Education details: OT d/c; goal progression Person educated: Patient Education method: Explanation, Demonstration, and Verbal cues Education comprehension: verbalized understanding, returned demonstration, verbal cues required, and needs further education  HOME EXERCISE PROGRAM: 05/06/2023: visual scanning activities 05/13/2023: red theraband and EC strategies 05/27/2023: memory strategies; stress reduction techniques  GOALS:  LONG TERM GOALS: Target date: 07/11/2023  Patient will demonstrate updated RUE HEP with visual handouts only for proper execution. Baseline:  05/27/2023: mod cueing Goal status: MET  2.  Patient will report at least two-point increase in average PSFS score or at least three-point increase in a single activity score indicating functionally significant improvement given minimum detectable change.  Baseline: 1.3 total score (See above for individual activity scores)  06/03/2023: 4.3 total score Goal status: MET  3.  Patient will independently verbalize at least 3 energy conservation principles  in relation to ADLs to increase functional independence.  Baseline:  05/27/2023: unable to recall 06/17/2023: recalled 2 07/09/2023: recalled 3 Goal status: MET  4.  Pt will demonstrate improvement to RUE MMT for shoulder flexion, abduction, elbow ext and flex.  Baseline: 4/5 Goal status: MET  ASSESSMENT:  CLINICAL IMPRESSION: Patient is appropriate for discharge and no longer demonstrates medical necessity for continued skilled occupational therapy services. PLAN:  OT D/C Completed  CONSULTED AND AGREED WITH PLAN OF CARE: Patient   Altamease Asters, OT 07/09/2023, 1:14 PM

## 2023-07-09 NOTE — Patient Instructions (Addendum)
 Pre-plan/write down all your errands   Write a grocery list   Forgetting items in room:  silently repeat in your head while walking to item verbally repeat aloud while walking to item Mentally picture the item/task you want   Possible Accommodations to manage cognitive functioning/fatigue:  Half days or half orientation days  Increased number breaks Off days back to back? Focus on research OR singular skill or fewer less taxing skills: dining, ticketed events, reservations, flowers/gifts  [place]

## 2023-07-14 ENCOUNTER — Other Ambulatory Visit: Payer: Self-pay | Admitting: Neurosurgery

## 2023-07-14 DIAGNOSIS — I609 Nontraumatic subarachnoid hemorrhage, unspecified: Secondary | ICD-10-CM | POA: Diagnosis not present

## 2023-07-16 ENCOUNTER — Encounter: Payer: Self-pay | Admitting: Speech Pathology

## 2023-07-16 ENCOUNTER — Ambulatory Visit: Admitting: Speech Pathology

## 2023-07-16 DIAGNOSIS — R29818 Other symptoms and signs involving the nervous system: Secondary | ICD-10-CM | POA: Diagnosis not present

## 2023-07-16 DIAGNOSIS — R41841 Cognitive communication deficit: Secondary | ICD-10-CM | POA: Diagnosis not present

## 2023-07-16 DIAGNOSIS — R41844 Frontal lobe and executive function deficit: Secondary | ICD-10-CM | POA: Diagnosis not present

## 2023-07-16 DIAGNOSIS — M6281 Muscle weakness (generalized): Secondary | ICD-10-CM | POA: Diagnosis not present

## 2023-07-16 NOTE — Therapy (Signed)
 OUTPATIENT SPEECH LANGUAGE PATHOLOGY TREATMENT   Patient Name: Madeline Gomez MRN: [id number] DOB:[birth year], 43 y.o., female Today's Date: 07/16/2023  PCP: none REFERRING PROVIDER: Augusto Blonder, MD  END OF SESSION:  End of Session - 07/16/23 0854     Visit Number 5    Number of Visits 13    Date for SLP Re-Evaluation 08/07/23    Authorization Type BCBS/Medicaid VL: 30 - 5/30 used at time of eval    SLP Start Time 0850    SLP Stop Time  0930    SLP Time Calculation (min) 40 min    Activity Tolerance Patient tolerated treatment well               Past Medical History:  Diagnosis Date   Anemia    Asthma    Broken bones    RIGHT FOOT    BV (bacterial vaginosis)    RECURRENT   H/O vitamin D  deficiency    Thyroid  disease    Past Surgical History:  Procedure Laterality Date   CESAREAN SECTION     IR ANGIO INTRA EXTRACRAN SEL INTERNAL CAROTID BILAT MOD SED  04/15/2023   IR ANGIO INTRA EXTRACRAN SEL INTERNAL CAROTID BILAT MOD SED  04/22/2023   IR ANGIO VERTEBRAL SEL VERTEBRAL BILAT MOD SED  04/15/2023   IR ANGIO VERTEBRAL SEL VERTEBRAL BILAT MOD SED  04/22/2023   IR US  GUIDE VASC ACCESS RIGHT  04/15/2023   RADIOLOGY WITH ANESTHESIA N/A 04/15/2023   Procedure: RADIOLOGY WITH ANESTHESIA;  Surgeon: Augusto Blonder, MD;  Location: [facility] OR;  Service: Radiology;  Laterality: N/A;   WISDOM TOOTH EXTRACTION     Patient Active Problem List   Diagnosis Date Noted   Mild persistent asthma without complication 04/17/2023   Asthma, persistent controlled 04/16/2023   SAH (subarachnoid hemorrhage) (HCC) 04/14/2023   Allergic rhinitis 01/02/2015   Asthma with acute exacerbation 05/02/2014   Asthma, chronic 07/06/2012    ONSET DATE: 06/04/2023 (referral date)   REFERRING DIAG: S06.6XAA (ICD-10-CM) - Subarachnoid hematom  THERAPY DIAG: Cognitive communication deficit  Rationale for Evaluation and Treatment: Rehabilitation  SUBJECTIVE:   SUBJECTIVE STATEMENT: I'm  pooped Pt accompanied by: self  PERTINENT HISTORY: PMH: asthma, thyroid  disease, anemia   Pt is a 43 y.o. female presenting 3/10 with frontal headache, dizziness, and nausea. 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 Now s/p radiology with anaesthesia in which no aneurysm was found. PMH significant for mild persistent asthma, thyroid  disease, anemia.   PAIN:  Are you having pain? No  FALLS: Has patient fallen in last 6 months?  Yes - slipped in bathtub a couple months ago  LIVING ENVIRONMENT: Lives with: lives with their family Lives in: House/apartment  PLOF:  Level of assistance: Independent with ADLs, Independent with IADLs Employment: Full-time employment  PATIENT GOALS: return to work   OBJECTIVE:  Note: Objective measures were completed at Evaluation unless otherwise noted.  TREATMENT DATE:   07/16/23: Madeline Gomez has been cleared to return to work 07/21/23. She expressed concern about returning with no restrictions. She has misplaced the handouts re: possible accommodations she can request at work - we reviewed these and new handout provided. Trained in energy conservation after work on her days off. Targeted organization strategies to support attention and memory including preparing clothes, snacks and meds the night before work, label files and documents in paper or on your computer - continue to reduce clutter. She has carried over initiating clutter reduction in her office but still has work to do. See Patient Instructions.   07/08/23: Reported busy day yesterday which was physically and cognitive fatiguing. Indicated frustration related to busy schedule but frustration not correlated to cognitive functioning. Endorsed cognition is better but reported difficulty with recalling items when transferring locations. Recommended verbal  coaching, internal repetition, and/or mental picture to assist with encoding information and accuracy of recall. Discussed possible accommodations for return to work to support cognitive functioning, see pt instructions. Endorsed return to prior iADLs x3 with success. Additional recommendations of external compensations to support cognition also in pt instructions. Pt verbalized understanding.   06/18/23: Endorsed some frustration during bill management this morning d/t overwhelm and need for prioritization. SLP initiated use of written organizer for bill pay management and to support cognitive functioning. Provided recommendations to manage to-do list and written organizers to reduce cognitive overload, see pt instructions. Reportedly forgot to call company yesterday, in which pt self-implemented use of reminder app to support prospective recall. Recommended writing detailed notes/comments during phone calls to support short term recall.   06/16/23: Madeline Gomez is successfully using reminder app in conjunction with alarms on phone. She reports return to cooking and light cleaning. We generated strategies to incorporate energy conservation with memory and attention including setting weekly priorities and daily to do lists to have written goals - cross off tasks as they are completed. She identified communicating with her daughter's track coach and filling out paperwork for school system as priorities this week as well on checking on bills. We generated strategy of breaking large tasks (bill paying) into smaller 15-20 minute increments with breaks to support attention. 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 See patient instructions.   06/11/23: ST evaluation and POC complete. Initiated education and instruction of external memory compensations, including reminders app on phone to aid  prospective recall. Pt had been using alarms with varied success. Able to set up reminder with rare min A. Continue to educate and train cognitive compensations in upcoming sessions.    PATIENT EDUCATION: Education details: see above Person educated: Patient Education method: Explanation Education comprehension: verbalized understanding and needs further education   GOALS: Goals reviewed with patient? Yes  SHORT TERM GOALS: Target date: 07/09/2023  Pt will teach back cognitive compensations x3 to support recall/attention  Baseline:  Goal status: PARTIALLY MET  2.   Pt will recall pertinent information across therapy sessions x2 with use of memory compensations  Baseline: 06/18/23, 07/09/23 Goal status: MET  3.  Pt will increase active engagement in prior iADLs x2 with use of trained compensations Baseline:  Goal status: MET  4.  Pt will demonstrate increased attention to detail with ability to self-correct 95% of errors on structured task  Baseline:  Goal status: PARTIALLY MET    LONG TERM GOALS: Target date: 08/07/2023  Pt will effectively  participate in role play work scenario with adequate attention/recall exhibited with use of trained techniques  Baseline:  Goal status: ONGOING  2.  Pt will report less reliance on family to support cognitive functioning at home > 1 week with use of trained techniques  Baseline:  Goal status: ONGOING  3.  Pt will report implementation of cognitive strategies x2 to support work functioning with success indicated  Baseline:  Goal status: ONGOING   ASSESSMENT:  CLINICAL IMPRESSION: Patient is a 43 y.o. F who was seen today for ST tx s/p stroke. 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 Reported job-related concerns d/t decline in attention to detail and memorization. Continued education and instruction of cognitive compensations to optimize daily functioning, with good carryover exhibited. Due to change in baseline, pt would benefit from skilled ST intervention to optimize cognitive functioning and QoL.   OBJECTIVE IMPAIRMENTS: include attention, memory, awareness, and executive functioning. These impairments are limiting patient from return to work and household responsibilities.Factors affecting potential to achieve goals and functional outcome are none. Patient will benefit from skilled SLP services to address above impairments and improve overall function.  REHAB POTENTIAL: Good  PLAN:  SLP FREQUENCY: 1-2x/week  SLP DURATION: 8 weeks  PLANNED INTERVENTIONS: Environmental controls, Cueing hierachy, Cognitive reorganization, Internal/external aids, Functional tasks, SLP instruction and feedback, Compensatory strategies, Patient/family education, [phone] Treatment of speech (30 or 45 min) , and [id number]- Speech Eval Sound Prod, Artic, Phon, Eval Compre, Express    Nikole Swartzentruber, Dareen Ebbing, CCC-SLP 07/16/2023, 9:23 AM

## 2023-07-16 NOTE — Patient Instructions (Signed)
   When you take your breaks at work, close your eyes in a quiet space and let your brain re-set (don't get on your phone, watch TV, read) Really just let your brain have no stimulation for the 15 minutes (after you check on your kids)  Your pennies will be totally spent after work and on your days off  - don't plan on going out or doing hard labor chores (mowing, cleaning the bathroom etc)  You will need to rest on your days off -   [place] job reducing clutter!!!  When you take breaks - do mindful breathing or meditation  Breathe2Relax app  Behavior is a language  Consider finding a therapist - ask your PCP

## 2023-07-17 ENCOUNTER — Ambulatory Visit (HOSPITAL_BASED_OUTPATIENT_CLINIC_OR_DEPARTMENT_OTHER): Admitting: Adult Health

## 2023-07-17 ENCOUNTER — Encounter (HOSPITAL_BASED_OUTPATIENT_CLINIC_OR_DEPARTMENT_OTHER): Payer: Self-pay | Admitting: Adult Health

## 2023-07-17 VITALS — BP 116/82 | HR 76 | Ht 63.0 in | Wt 225.0 lb

## 2023-07-17 DIAGNOSIS — J302 Other seasonal allergic rhinitis: Secondary | ICD-10-CM

## 2023-07-17 DIAGNOSIS — J4530 Mild persistent asthma, uncomplicated: Secondary | ICD-10-CM | POA: Diagnosis not present

## 2023-07-17 MED ORDER — AIRSUPRA 90-80 MCG/ACT IN AERO: 2.0000 | INHALATION_SPRAY | Freq: Four times a day (QID) | RESPIRATORY_TRACT | 5 refills | Status: AC | PRN

## 2023-07-17 MED ORDER — MONTELUKAST SODIUM 10 MG PO TABS: 10.0000 mg | ORAL_TABLET | Freq: Every day | ORAL | 11 refills | Status: AC

## 2023-07-17 NOTE — Progress Notes (Signed)
 @Patient  ID: Elease Grice, female    DOB: Oct 13, 1980, 43 y.o.   MRN: [id number]  Chief Complaint  Patient presents with   Follow-up    Asthma    Referring provider: No ref. provider found  HPI: 43 year old female never smoker followed for mild persistent asthma and allergic rhinitis   TEST/EVENTS :  Spirometry  11/2012 - FEv1 77%, FVC 101%, ratio 65 s/o mild obstruction     08/30/2013 Spirometry >>moderate airway obstruction with FEV1 65%, FVC 86% and ratio 65   Spirometry 04/2017 mild airway obstruction with ratio 71, FEV1 of 80% and FVC of 94%  Chest xray 03/2020 clear   07/17/2023 Follow up: Asthma and Allergic Rhinitis  Patient presents for a follow-up visit.  Last seen February 2024.  She is followed for mild persistent asthma and allergic rhinitis.  She says overall breathing has been doing well.  She denies any flare of cough or wheezing.  She was on Breo daily but insurance would not cover.  Was changed over to Dulera due to formulary requirements.  Says she did not try this.  She has been having significant allergy symptoms.  Recently seen at an allergist with significant allergen triggers with multiple on her panel including trees grasses, mold, dust mites, feathers, cat, dog.  She has been recommended for allergy vaccine however says that she cannot afford this.  She has started Zyrtec and Flonase.  She was started on Airsupra.  Says that she has not had to use this yet.  PFTs in 2022 were normal with no airflow obstruction or restriction.  Mild bronchodilator response.  Says she was previously on Singulair  years ago.  She has no indoor pets.  No basement.  She is a never smoker.   Patient did experience a stroke in March .  Presented with a severe headache found to have moderate acute subarachnoid hemorrhage.  She has been undergoing occupational and speech therapy.  She has been cleared to go back to work she works from home.       Allergies  Allergen Reactions    Other Hives and Other (See Comments)    PT IS ALLERGIC TO GRASS, CATS, TOMATOES, PINEAPPLES, POLLEN   Percocet [Oxycodone -Acetaminophen ] Itching   Prednisone  Itching    Rapid heart rate, made me feel bad    There is no immunization history for the selected administration types on file for this patient.  Past Medical History:  Diagnosis Date   Anemia    Asthma    Broken bones    RIGHT FOOT    BV (bacterial vaginosis)    RECURRENT   H/O vitamin D  deficiency    Thyroid  disease     Tobacco History: Social History   Tobacco Use  Smoking Status Never  Smokeless Tobacco Never   Counseling given: Not Answered   Outpatient Medications Prior to Visit  Medication Sig Dispense Refill   cetirizine (ZYRTEC) 10 MG tablet Take 10 mg by mouth daily as needed for rhinitis or allergies.     levonorgestrel  (MIRENA , 52 MG,) 20 MCG/DAY IUD 1 each by Intrauterine route once.     metroNIDAZOLE (METROCREAM) 0.75 % cream Apply 1 Application topically 2 (two) times daily.     Cholecalciferol 1.25 MG (50000 UT) capsule Take 50,000 Units by mouth once a week. Thursday (Patient not taking: Reported on 07/17/2023)     niMODipine  (NIMOTOP ) 30 MG capsule Take 2 capsules (60 mg total) by mouth every 4 (four) hours for 13 days. (  Patient not taking: Reported on 07/17/2023) 156 capsule 0   albuterol  (VENTOLIN  HFA) 108 (90 Base) MCG/ACT inhaler Inhale 2 puffs into the lungs every 4 (four) hours as needed for wheezing. (Patient not taking: Reported on 07/17/2023) 1 each 3   fluticasone  furoate-vilanterol (BREO ELLIPTA ) 100-25 MCG/INH 1 puff      No facility-administered medications prior to visit.     Review of Systems:   Constitutional:   No  weight loss, night sweats,  Fevers, chills, fatigue, or  lassitude.  HEENT:   No headaches,  Difficulty swallowing,  Tooth/dental problems, or  Sore throat,                No sneezing, itching, ear ache, +nasal congestion, post nasal drip,   CV:  No chest pain,   Orthopnea, PND, swelling in lower extremities, anasarca, dizziness, palpitations, syncope.   GI  No heartburn, indigestion, abdominal pain, nausea, vomiting, diarrhea, change in bowel habits, loss of appetite, bloody stools.   Resp: No shortness of breath with exertion or at rest.  No excess mucus, no productive cough,  No non-productive cough,  No coughing up of blood.  No change in color of mucus.  No wheezing.  No chest wall deformity  Skin: no rash or lesions.  GU: no dysuria, change in color of urine, no urgency or frequency.  No flank pain, no hematuria   MS:  No joint pain or swelling.  No decreased range of motion.  No back pain.    Physical Exam  BP 116/82   Pulse 76   Ht 5' 3 (1.6 m)   Wt 225 lb (102.1 kg)   BMI 39.86 kg/m   GEN: A/Ox3; pleasant , NAD, well nourished    HEENT:  [place]/AT,  NOSE-clear, THROAT-clear, no lesions, no postnasal drip or exudate noted.   NECK:  Supple w/ fair ROM; no JVD; normal carotid impulses w/o bruits; no thyromegaly or nodules palpated; no lymphadenopathy.    RESP  Clear  P & A; w/o, wheezes/ rales/ or rhonchi. no accessory muscle use, no dullness to percussion  CARD:  RRR, no m/r/g, no peripheral edema, pulses intact, no cyanosis or clubbing.  GI:   Soft & nt; nml bowel sounds; no organomegaly or masses detected.   Musco: Warm bil, no deformities or joint swelling noted.   Neuro: alert, no focal deficits noted.    Skin: Warm, no lesions or rashes    Lab Results:  CBC    Component Value Date/Time   WBC 8.7 04/21/2023 0654   RBC 4.19 04/21/2023 0654   HGB 11.6 (L) 04/21/2023 0654   HCT 35.5 (L) 04/21/2023 0654   PLT 304 04/21/2023 0654   MCV 84.7 04/21/2023 0654   MCH 27.7 04/21/2023 0654   MCHC 32.7 04/21/2023 0654   RDW 14.0 04/21/2023 0654   LYMPHSABS 1.3 01/10/2010 1358   MONOABS 0.5 01/10/2010 1358   EOSABS 0.2 01/10/2010 1358   BASOSABS 0.1 01/10/2010 1358    BMET    Component Value Date/Time   NA 137  04/21/2023 0654   K 3.8 04/21/2023 0654   CL 102 04/21/2023 0654   CO2 25 04/21/2023 0654   GLUCOSE 112 (H) 04/21/2023 0654   BUN <5 (L) 04/21/2023 0654   CREATININE 0.67 04/21/2023 0654   CALCIUM 8.9 04/21/2023 0654   GFRNONAA >60 04/21/2023 0654   GFRAA  01/10/2010 1358    >60        The eGFR has been calculated using  the MDRD equation. This calculation has not been validated in all clinical situations. eGFR's persistently <60 mL/min signify possible Chronic Kidney Disease.    BNP No results found for: BNP  ProBNP No results found for: PROBNP  Imaging: No results found.  Administration History     None          Latest Ref Rng & Units 06/01/2020   11:08 AM  PFT Results  FVC-Pre L 2.46   FVC-Predicted Pre % 81   FVC-Post L 2.55   FVC-Predicted Post % 84   Pre FEV1/FVC % % 75   Post FEV1/FCV % % 80   FEV1-Pre L 1.85   FEV1-Predicted Pre % 74   FEV1-Post L 2.05   DLCO uncorrected ml/min/mmHg 21.23   DLCO UNC% % 98   DLCO corrected ml/min/mmHg 21.23   DLCO COR %Predicted % 98   DLVA Predicted % 130   TLC L 3.62   TLC % Predicted % 72   RV % Predicted % 69     No results found for: NITRICOXIDE      Assessment & Plan:   Mild persistent asthma without complication Mild intermittent/persistent asthma currently under good control.  Previously was on Breo but unfortunately insurance would not cover.  She has recently been changed over to Airsupra with no significant symptom burden.  Advise if she develops cough wheezing or recurrent asthma flares will need to be on a maintenance inhaler for now can continue with Airsupra as needed.  Control for triggers.  Asthma action plan discussed in detail  Plan  Patient Instructions  Begin Singulair  10mg  At bedtime   Continue on Zyrtec and Flonase daily,  Use Airsupra inhaler  /Albuterol  Neb As needed  Every  6 hr for wheezing .  Follow up with Dr. Villa Greaser in in 4-6 months and As needed   Please contact  office for sooner follow up if symptoms do not improve or worsen or seek emergency care     Allergic rhinitis Patient with significant allergy symptoms.  Recently seen by allergist with significant allergen panel-positive skin test.  Continue on Zyrtec and Flonase daily.  Add in Singulair  daily.  Patient has been recommended for allergy shots but unfortunately cannot afford.  Plan  Patient Instructions  Begin Singulair  10mg  At bedtime   Continue on Zyrtec and Flonase daily,  Use Airsupra inhaler  /Albuterol  Neb As needed  Every  6 hr for wheezing .  Follow up with Dr. Villa Greaser in in 4-6 months and As needed   Please contact office for sooner follow up if symptoms do not improve or worsen or seek emergency care       Roena Clark, NP 07/17/2023

## 2023-07-17 NOTE — Patient Instructions (Addendum)
 Begin Singulair  10mg  At bedtime   Continue on Zyrtec and Flonase daily,  Use Airsupra inhaler  /Albuterol  Neb As needed  Every  6 hr for wheezing .  Follow up with Dr. Villa Greaser in in 4-6 months and As needed   Please contact office for sooner follow up if symptoms do not improve or worsen or seek emergency care

## 2023-07-17 NOTE — Assessment & Plan Note (Signed)
 Patient with significant allergy symptoms.  Recently seen by allergist with significant allergen panel-positive skin test.  Continue on Zyrtec and Flonase daily.  Add in Singulair  daily.  Patient has been recommended for allergy shots but unfortunately cannot afford.  Plan  Patient Instructions  Begin Singulair  10mg  At bedtime   Continue on Zyrtec and Flonase daily,  Use Airsupra inhaler  /Albuterol  Neb As needed  Every  6 hr for wheezing .  Follow up with Dr. Villa Greaser in in 4-6 months and As needed   Please contact office for sooner follow up if symptoms do not improve or worsen or seek emergency care

## 2023-07-17 NOTE — Assessment & Plan Note (Signed)
 Mild intermittent/persistent asthma currently under good control.  Previously was on Breo but unfortunately insurance would not cover.  She has recently been changed over to Airsupra with no significant symptom burden.  Advise if she develops cough wheezing or recurrent asthma flares will need to be on a maintenance inhaler for now can continue with Airsupra as needed.  Control for triggers.  Asthma action plan discussed in detail  Plan  Patient Instructions  Begin Singulair  10mg  At bedtime   Continue on Zyrtec and Flonase daily,  Use Airsupra inhaler  /Albuterol  Neb As needed  Every  6 hr for wheezing .  Follow up with Dr. Villa Greaser in in 4-6 months and As needed   Please contact office for sooner follow up if symptoms do not improve or worsen or seek emergency care

## 2023-07-21 ENCOUNTER — Ambulatory Visit: Admitting: Speech Pathology

## 2023-07-21 ENCOUNTER — Encounter: Payer: Self-pay | Admitting: Speech Pathology

## 2023-07-21 DIAGNOSIS — R41844 Frontal lobe and executive function deficit: Secondary | ICD-10-CM | POA: Diagnosis not present

## 2023-07-21 DIAGNOSIS — M6281 Muscle weakness (generalized): Secondary | ICD-10-CM | POA: Diagnosis not present

## 2023-07-21 DIAGNOSIS — R41841 Cognitive communication deficit: Secondary | ICD-10-CM

## 2023-07-21 DIAGNOSIS — R29818 Other symptoms and signs involving the nervous system: Secondary | ICD-10-CM | POA: Diagnosis not present

## 2023-07-21 NOTE — Therapy (Signed)
 OUTPATIENT SPEECH LANGUAGE PATHOLOGY TREATMENT   Patient Name: Madeline Gomez MRN: [id number] DOB:[birth year], 43 y.o., female Today's Date: 07/21/2023  PCP: none REFERRING PROVIDER: Augusto Blonder, MD  END OF SESSION:  End of Session - 07/21/23 0846     Visit Number 6    Number of Visits 13    Date for SLP Re-Evaluation 08/07/23    Authorization Type BCBS/Medicaid VL: 30 - 5/30 used at time of eval    Authorization - Visit Number 6    Authorization - Number of Visits 25    SLP Start Time [phone]    SLP Stop Time  0930    SLP Time Calculation (min) 43 min    Activity Tolerance Patient tolerated treatment well            Past Medical History:  Diagnosis Date   Anemia    Asthma    Broken bones    RIGHT FOOT    BV (bacterial vaginosis)    RECURRENT   H/O vitamin D  deficiency    Thyroid  disease    Past Surgical History:  Procedure Laterality Date   CESAREAN SECTION     IR ANGIO INTRA EXTRACRAN SEL INTERNAL CAROTID BILAT MOD SED  04/15/2023   IR ANGIO INTRA EXTRACRAN SEL INTERNAL CAROTID BILAT MOD SED  04/22/2023   IR ANGIO VERTEBRAL SEL VERTEBRAL BILAT MOD SED  04/15/2023   IR ANGIO VERTEBRAL SEL VERTEBRAL BILAT MOD SED  04/22/2023   IR US  GUIDE VASC ACCESS RIGHT  04/15/2023   RADIOLOGY WITH ANESTHESIA N/A 04/15/2023   Procedure: RADIOLOGY WITH ANESTHESIA;  Surgeon: Augusto Blonder, MD;  Location: [facility] OR;  Service: Radiology;  Laterality: N/A;   WISDOM TOOTH EXTRACTION     Patient Active Problem List   Diagnosis Date Noted   Mild persistent asthma without complication 04/17/2023   Asthma, persistent controlled 04/16/2023   SAH (subarachnoid hemorrhage) (HCC) 04/14/2023   Allergic rhinitis 01/02/2015   Asthma with acute exacerbation 05/02/2014   Asthma, chronic 07/06/2012    ONSET DATE: 06/04/2023 (referral date)   REFERRING DIAG: S06.6XAA (ICD-10-CM) - Subarachnoid hematom  THERAPY DIAG: Cognitive communication deficit  Rationale for Evaluation and  Treatment: Rehabilitation  SUBJECTIVE:   SUBJECTIVE STATEMENT: I am going to spend a lot of pennies today Pt accompanied by: self  PERTINENT HISTORY: PMH: asthma, thyroid  disease, anemia   Pt is a 43 y.o. female presenting 3/10 with frontal headache, dizziness, and nausea. 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 Now s/p radiology with anaesthesia in which no aneurysm was found. PMH significant for mild persistent asthma, thyroid  disease, anemia.   PAIN:  Are you having pain? No  FALLS: Has patient fallen in last 6 months?  Yes - slipped in bathtub a couple months ago  LIVING ENVIRONMENT: Lives with: lives with their family Lives in: House/apartment  PLOF:  Level of assistance: Independent with ADLs, Independent with IADLs Employment: Full-time employment  PATIENT GOALS: return to work   OBJECTIVE:  Note: Objective measures were completed at Evaluation unless otherwise noted.  TREATMENT DATE:   07/21/23: Madeline Gomez is demonstrating adequate carryover of energy conservation as well as strategies to support attention and concentration by continuing to de-clutter her office. She has pre-planned her day to go to 3 grocery stores with lists for each store and lists organized according to department in the grocery store. She verbalizes that she will take breaks in between stores and will need to rest after this shopping. She reports most chores at home are being completed with minimal A from family and endorses increased independence at home. Role play different situations and problems she may encounter upon return to work for [employer], including last minute reservations, disclosing up front fees and recalling the most popular hot spots in [place], [place], [place] etc. She verbalized [employer] websites that inform their customers to find  preferred tickets and shows. Madeline Gomez Id'd 5 other services she deals with including floral, gifts, spirits., and spa services, tee times with supervision cues. We role played her metric of care and connect to achieve her quality control scores with occasional min questioning cues.     07/16/23: Madeline Gomez has been cleared to return to work 07/21/23. She expressed concern about returning with no restrictions. She has misplaced the handouts re: possible accommodations she can request at work - we reviewed these and new handout provided. Trained in energy conservation after work on her days off. Targeted organization strategies to support attention and memory including preparing clothes, snacks and meds the night before work, label files and documents in paper or on your computer - continue to reduce clutter. She has carried over initiating clutter reduction in her office but still has work to do. See Patient Instructions.   07/08/23: Reported busy day yesterday which was physically and cognitive fatiguing. Indicated frustration related to busy schedule but frustration not correlated to cognitive functioning. Endorsed cognition is better but reported difficulty with recalling items when transferring locations. Recommended verbal coaching, internal repetition, and/or mental picture to assist with encoding information and accuracy of recall. Discussed possible accommodations for return to work to support cognitive functioning, see pt instructions. Endorsed return to prior iADLs x3 with success. Additional recommendations of external compensations to support cognition also in pt instructions. Pt verbalized understanding.   06/18/23: Endorsed some frustration during bill management this morning d/t overwhelm and need for prioritization. SLP initiated use of written organizer for bill pay management and to support cognitive functioning. Provided recommendations to manage to-do list and written organizers to reduce  cognitive overload, see pt instructions. Reportedly forgot to call company yesterday, in which pt self-implemented use of reminder app to support prospective recall. Recommended writing detailed notes/comments during phone calls to support short term recall.   06/16/23: Madeline Gomez is successfully using reminder app in conjunction with alarms on phone. She reports return to cooking and light cleaning. We generated strategies to incorporate energy conservation with memory and attention including setting weekly priorities and daily to do lists to have written goals - cross off tasks as they are completed. She identified communicating with her daughter's track coach and filling out paperwork for school system as priorities this week as well on checking on bills. We generated strategy of breaking large tasks (bill paying) into smaller 15-20 minute increments with breaks to support attention. 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 See patient instructions.   06/11/23: ST evaluation and POC complete. Initiated education  and instruction of external memory compensations, including reminders app on phone to aid prospective recall. Pt had been using alarms with varied success. Able to set up reminder with rare min A. Continue to educate and train cognitive compensations in upcoming sessions.    PATIENT EDUCATION: Education details: see above Person educated: Patient Education method: Explanation Education comprehension: verbalized understanding and needs further education   GOALS: Goals reviewed with patient? Yes  SHORT TERM GOALS: Target date: 07/09/2023  Pt will teach back cognitive compensations x3 to support recall/attention  Baseline:  Goal status: PARTIALLY MET  2.   Pt will recall pertinent information across therapy sessions x2 with use of memory compensations  Baseline:  06/18/23, 07/09/23 Goal status: MET  3.  Pt will increase active engagement in prior iADLs x2 with use of trained compensations Baseline:  Goal status: MET  4.  Pt will demonstrate increased attention to detail with ability to self-correct 95% of errors on structured task  Baseline:  Goal status: PARTIALLY MET    LONG TERM GOALS: Target date: 08/07/2023  Pt will effectively participate in role play work scenario with adequate attention/recall exhibited with use of trained techniques  Baseline:  Goal status: ONGOING  2.  Pt will report less reliance on family to support cognitive functioning at home > 1 week with use of trained techniques  Baseline:  Goal status: ONGOING  3.  Pt will report implementation of cognitive strategies x2 to support work functioning with success indicated  Baseline:  Goal status: ONGOING   ASSESSMENT:  CLINICAL IMPRESSION: Patient is a 43 y.o. F who was seen today for ST tx s/p stroke. 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 Reported job-related concerns d/t decline in attention to detail and memorization. Continued education and instruction of cognitive compensations to optimize daily functioning, with good carryover exhibited. Due to change in baseline, pt would benefit from skilled ST intervention to optimize cognitive functioning and QoL.   OBJECTIVE IMPAIRMENTS: include attention, memory, awareness, and executive functioning. These impairments are limiting patient from return to work and household responsibilities.Factors affecting potential to achieve goals and functional outcome are none. Patient will benefit from skilled SLP services to address above impairments and improve  overall function.  REHAB POTENTIAL: Good  PLAN:  SLP FREQUENCY: 1-2x/week  SLP DURATION: 8 weeks  PLANNED INTERVENTIONS: Environmental controls, Cueing hierachy, Cognitive reorganization, Internal/external aids, Functional tasks, SLP instruction and feedback, Compensatory strategies, Patient/family education, [phone] Treatment of speech (30 or 45 min) , and [id number]- Speech Eval Sound Prod, Artic, Phon, Eval Compre, Express    Madeline Gomez, Dareen Ebbing, CCC-SLP 07/21/2023, 9:23 AM

## 2023-07-21 NOTE — Patient Instructions (Signed)
  Research call vs e mail   Take info, go over policies, get permission and security code  Any new policies at [employer], new hot spots   New shows and events  Focus on conversation, typing and listening, keeping train of thought, planning course of action  Limit family distractions, don't check your phone unless it is an emergency  Continue to de clutter and maintain clean office space

## 2023-07-29 NOTE — Therapy (Unsigned)
 OUTPATIENT SPEECH LANGUAGE PATHOLOGY TREATMENT   Patient Name: Madeline Gomez MRN: [id number] DOB:26-May-1980, 43 y.o., female Today's Date: 07/30/2023  PCP: none REFERRING PROVIDER: Lanis Pupa, MD  END OF SESSION:  End of Session - 07/30/23 0801     Visit Number 7    Number of Visits 13    Date for SLP Re-Evaluation 08/07/23    Authorization Type BCBS/Medicaid VL: 30 - 5/30 used at time of eval    Authorization - Visit Number 7    Authorization - Number of Visits 25    SLP Start Time 0803    SLP Stop Time  0845    SLP Time Calculation (min) 42 min    Activity Tolerance Patient tolerated treatment well             Past Medical History:  Diagnosis Date   Anemia    Asthma    Broken bones    RIGHT FOOT    BV (bacterial vaginosis)    RECURRENT   H/O vitamin D  deficiency    Thyroid  disease    Past Surgical History:  Procedure Laterality Date   CESAREAN SECTION     IR ANGIO INTRA EXTRACRAN SEL INTERNAL CAROTID BILAT MOD SED  04/15/2023   IR ANGIO INTRA EXTRACRAN SEL INTERNAL CAROTID BILAT MOD SED  04/22/2023   IR ANGIO VERTEBRAL SEL VERTEBRAL BILAT MOD SED  04/15/2023   IR ANGIO VERTEBRAL SEL VERTEBRAL BILAT MOD SED  04/22/2023   IR US  GUIDE VASC ACCESS RIGHT  04/15/2023   RADIOLOGY WITH ANESTHESIA N/A 04/15/2023   Procedure: RADIOLOGY WITH ANESTHESIA;  Surgeon: Lanis Pupa, MD;  Location: [facility] OR;  Service: Radiology;  Laterality: N/A;   WISDOM TOOTH EXTRACTION     Patient Active Problem List   Diagnosis Date Noted   Mild persistent asthma without complication 04/17/2023   Asthma, persistent controlled 04/16/2023   SAH (subarachnoid hemorrhage) (HCC) 04/14/2023   Allergic rhinitis 01/02/2015   Asthma with acute exacerbation 05/02/2014   Asthma, chronic 07/06/2012    ONSET DATE: 06/04/2023 (referral date)   REFERRING DIAG: S06.6XAA (ICD-10-CM) - Subarachnoid hematom  THERAPY DIAG: Cognitive communication deficit  Rationale for Evaluation and  Treatment: Rehabilitation  SUBJECTIVE:   SUBJECTIVE STATEMENT: I go back July 7th Pt accompanied by: self  PERTINENT HISTORY: PMH: asthma, thyroid  disease, anemia   Pt is a 43 y.o. female presenting 3/10 with frontal headache, dizziness, and nausea. 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 Now s/p radiology with anaesthesia in which no aneurysm was found. PMH significant for mild persistent asthma, thyroid  disease, anemia.   PAIN:  Are you having pain? No  FALLS: Has patient fallen in last 6 months?  Yes - slipped in bathtub a couple months ago  LIVING ENVIRONMENT: Lives with: lives with their family Lives in: House/apartment  PLOF:  Level of assistance: Independent with ADLs, Independent with IADLs Employment: Full-time employment  PATIENT GOALS: return to work   OBJECTIVE:  Note: Objective measures were completed at Evaluation unless otherwise noted.  TREATMENT DATE:  07/29/23: Reported updated return to work date and request for SLP to complete accomodation form - pending supervisor approval. Targeted work simulated task, with pt recalling popular restaurants and potential limitations. Pt able to recall x13 restaurants with occasional extra processing time. Recommended targeting work-related tasks (ex: recalling important places/details and research) as part of HEP to maximize cognitive functioning pending upcoming return to work.   07/21/23: Madeline Gomez is demonstrating adequate carryover of energy conservation as well as strategies to support attention and concentration by continuing to de-clutter her office. She has pre-planned her day to go to 3 grocery stores with lists for each store and lists organized according to department in the grocery store. She verbalizes that she will take breaks in between stores and will need to rest  after this shopping. She reports most chores at home are being completed with minimal A from family and endorses increased independence at home. Role play different situations and problems she may encounter upon return to work for [employer], including last minute reservations, disclosing up front fees and recalling the most popular hot spots in [place], [place], [place] etc. She verbalized [employer] websites that inform their customers to find preferred tickets and shows. Madeline Gomez Id'd 5 other services she deals with including floral, gifts, spirits., and spa services, tee times with supervision cues. We role played her metric of care and connect to achieve her quality control scores with occasional min questioning cues.     07/16/23: Madeline Gomez has been cleared to return to work 07/21/23. She expressed concern about returning with no restrictions. She has misplaced the handouts re: possible accommodations she can request at work - we reviewed these and new handout provided. Trained in energy conservation after work on her days off. Targeted organization strategies to support attention and memory including preparing clothes, snacks and meds the night before work, label files and documents in paper or on your computer - continue to reduce clutter. She has carried over initiating clutter reduction in her office but still has work to do. See Patient Instructions.   07/08/23: Reported busy day yesterday which was physically and cognitive fatiguing. Indicated frustration related to busy schedule but frustration not correlated to cognitive functioning. Endorsed cognition is better but reported difficulty with recalling items when transferring locations. Recommended verbal coaching, internal repetition, and/or mental picture to assist with encoding information and accuracy of recall. Discussed possible accommodations for return to work to support cognitive functioning, see pt instructions. Endorsed return to prior iADLs x3  with success. Additional recommendations of external compensations to support cognition also in pt instructions. Pt verbalized understanding.   06/18/23: Endorsed some frustration during bill management this morning d/t overwhelm and need for prioritization. SLP initiated use of written organizer for bill pay management and to support cognitive functioning. Provided recommendations to manage to-do list and written organizers to reduce cognitive overload, see pt instructions. Reportedly forgot to call company yesterday, in which pt self-implemented use of reminder app to support prospective recall. Recommended writing detailed notes/comments during phone calls to support short term recall.   06/16/23: Madeline Gomez is successfully using reminder app in conjunction with alarms on phone. She reports return to cooking and light cleaning. We generated strategies to incorporate energy conservation with memory and attention including setting weekly priorities and daily to do lists to have written goals - cross off tasks as they are completed. She identified communicating with her daughter's track coach and filling out paperwork for school system as  priorities this week as well on checking on bills. We generated strategy of breaking large tasks (bill paying) into smaller 15-20 minute increments with breaks to support attention. 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 See patient instructions.   06/11/23: ST evaluation and POC complete. Initiated education and instruction of external memory compensations, including reminders app on phone to aid prospective recall. Pt had been using alarms with varied success. Able to set up reminder with rare min A. Continue to educate and train cognitive compensations in upcoming sessions.    PATIENT EDUCATION: Education details: see above Person educated:  Patient Education method: Explanation Education comprehension: verbalized understanding and needs further education   GOALS: Goals reviewed with patient? Yes  SHORT TERM GOALS: Target date: 07/09/2023  Pt will teach back cognitive compensations x3 to support recall/attention  Baseline:  Goal status: PARTIALLY MET  2.   Pt will recall pertinent information across therapy sessions x2 with use of memory compensations  Baseline: 06/18/23, 07/09/23 Goal status: MET  3.  Pt will increase active engagement in prior iADLs x2 with use of trained compensations Baseline:  Goal status: MET  4.  Pt will demonstrate increased attention to detail with ability to self-correct 95% of errors on structured task  Baseline:  Goal status: PARTIALLY MET    LONG TERM GOALS: Target date: 08/07/2023  Pt will effectively participate in role play work scenario with adequate attention/recall exhibited with use of trained techniques  Baseline: 07/30/23 Goal status: ONGOING  2.  Pt will report less reliance on family to support cognitive functioning at home > 1 week with use of trained techniques  Baseline:  Goal status: ONGOING  3.  Pt will report implementation of cognitive strategies x2 to support work functioning with success indicated  Baseline:  Goal status: ONGOING   ASSESSMENT:  CLINICAL IMPRESSION: Patient is a 43 y.o. F who was seen today for ST tx s/p stroke. Continued education and instruction of cognitive compensations to optimize daily functioning, with good carryover exhibited. Due to change in baseline, pt would benefit from skilled ST intervention to optimize cognitive functioning and QoL.   OBJECTIVE IMPAIRMENTS: include attention, memory, awareness, and executive functioning. These impairments are limiting patient from return to work and household responsibilities.Factors affecting potential to achieve goals and functional outcome are none. Patient will benefit from skilled SLP services to  address above impairments and improve overall function.  REHAB POTENTIAL: Good  PLAN:  SLP FREQUENCY: 1-2x/week  SLP DURATION: 8 weeks  PLANNED INTERVENTIONS: Environmental controls, Cueing hierachy, Cognitive reorganization, Internal/external aids, Functional tasks, SLP instruction and feedback, Compensatory strategies, Patient/family education, [phone] Treatment of speech (30 or 45 min) , and [id number]- Speech Eval Sound Prod, Artic, Phon, Eval Compre, Express    [employer], CCC-SLP 07/30/2023, 8:02 AM

## 2023-07-30 ENCOUNTER — Ambulatory Visit

## 2023-07-30 DIAGNOSIS — R29818 Other symptoms and signs involving the nervous system: Secondary | ICD-10-CM | POA: Diagnosis not present

## 2023-07-30 DIAGNOSIS — R41841 Cognitive communication deficit: Secondary | ICD-10-CM

## 2023-07-30 DIAGNOSIS — M6281 Muscle weakness (generalized): Secondary | ICD-10-CM | POA: Diagnosis not present

## 2023-07-30 DIAGNOSIS — R41844 Frontal lobe and executive function deficit: Secondary | ICD-10-CM | POA: Diagnosis not present

## 2023-07-30 NOTE — Patient Instructions (Addendum)
 Popular [place]  Restaurants: Clinical cytogeneticist [place] [employer]  [employer]  [employer]  [place]  [place] [employer]  [place]  [employer]       Online Only: [place]  [place]  [address] [place]      Can't Contact List:

## 2023-08-05 NOTE — Therapy (Unsigned)
 OUTPATIENT SPEECH LANGUAGE PATHOLOGY TREATMENT   Patient Name: Madeline Gomez MRN: [id number] DOB:02-05-1980, 43 y.o., female Today's Date: 08/06/2023  PCP: none REFERRING PROVIDER: Lanis Pupa, MD  END OF SESSION:  End of Session - 08/06/23 0803     Visit Number 8    Number of Visits 13    Date for SLP Re-Evaluation 08/07/23    Authorization Type BCBS/Medicaid VL: 30 - 5/30 used at time of eval    Authorization - Visit Number 8    Authorization - Number of Visits 25    SLP Start Time 0803    SLP Stop Time  0845    SLP Time Calculation (min) 42 min    Activity Tolerance Patient tolerated treatment well              Past Medical History:  Diagnosis Date   Anemia    Asthma    Broken bones    RIGHT FOOT    BV (bacterial vaginosis)    RECURRENT   H/O vitamin D  deficiency    Thyroid  disease    Past Surgical History:  Procedure Laterality Date   CESAREAN SECTION     IR ANGIO INTRA EXTRACRAN SEL INTERNAL CAROTID BILAT MOD SED  04/15/2023   IR ANGIO INTRA EXTRACRAN SEL INTERNAL CAROTID BILAT MOD SED  04/22/2023   IR ANGIO VERTEBRAL SEL VERTEBRAL BILAT MOD SED  04/15/2023   IR ANGIO VERTEBRAL SEL VERTEBRAL BILAT MOD SED  04/22/2023   IR US  GUIDE VASC ACCESS RIGHT  04/15/2023   RADIOLOGY WITH ANESTHESIA N/A 04/15/2023   Procedure: RADIOLOGY WITH ANESTHESIA;  Surgeon: Lanis Pupa, MD;  Location: [facility] OR;  Service: Radiology;  Laterality: N/A;   WISDOM TOOTH EXTRACTION     Patient Active Problem List   Diagnosis Date Noted   Mild persistent asthma without complication 04/17/2023   Asthma, persistent controlled 04/16/2023   SAH (subarachnoid hemorrhage) (HCC) 04/14/2023   Allergic rhinitis 01/02/2015   Asthma with acute exacerbation 05/02/2014   Asthma, chronic 07/06/2012    ONSET DATE: 06/04/2023 (referral date)   REFERRING DIAG: S06.6XAA (ICD-10-CM) - Subarachnoid hematom  THERAPY DIAG: Cognitive communication deficit  Rationale for Evaluation and  Treatment: Rehabilitation  SUBJECTIVE:   SUBJECTIVE STATEMENT: okay Pt accompanied by: self  PERTINENT HISTORY: PMH: asthma, thyroid  disease, anemia   Pt is a 43 y.o. female presenting 3/10 with frontal headache, dizziness, and nausea. 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 Now s/p radiology with anaesthesia in which no aneurysm was found. PMH significant for mild persistent asthma, thyroid  disease, anemia.   PAIN:  Are you having pain? No  FALLS: Has patient fallen in last 6 months?  Yes - slipped in bathtub a couple months ago  LIVING ENVIRONMENT: Lives with: lives with their family Lives in: House/apartment  PLOF:  Level of assistance: Independent with ADLs, Independent with IADLs Employment: Full-time employment  PATIENT GOALS: return to work   OBJECTIVE:  Note: Objective measures were completed at Evaluation unless otherwise noted.  TREATMENT DATE:  08/06/23: Discussed recent personal events increasing stress and anxiety. Provided recommendations for energy conversation and reducing cognitive overload in light of these events. Reviewed possible work accommodations related to upcoming return to work. Pt able to recall several previously discussed accommodations with good accuracy. Provided handout with potential accommodations, as SLP unable to complete accomodation paperwork per therapy department policy. Re-educated external and internal memory strategies, including how to modify recall reminders on phone and acting immediately when possible to reduce need for extended recall. Pt verbalized understanding.   07/29/23: Reported updated return to work date and request for SLP to complete accomodation form - pending supervisor approval. Targeted work simulated task, with pt recalling popular restaurants and potential limitations.  Pt able to recall x13 restaurants with occasional extra processing time. Recommended targeting work-related tasks (ex: recalling important places/details and research) as part of HEP to maximize cognitive functioning pending upcoming return to work.   07/21/23: Yarlin is demonstrating adequate carryover of energy conservation as well as strategies to support attention and concentration by continuing to de-clutter her office. She has pre-planned her day to go to 3 grocery stores with lists for each store and lists organized according to department in the grocery store. She verbalizes that she will take breaks in between stores and will need to rest after this shopping. She reports most chores at home are being completed with minimal A from family and endorses increased independence at home. Role play different situations and problems she may encounter upon return to work for [employer], including last minute reservations, disclosing up front fees and recalling the most popular hot spots in [place], [place], [place] etc. She verbalized [employer] websites that inform their customers to find preferred tickets and shows. Dula Id'd 5 other services she deals with including floral, gifts, spirits., and spa services, tee times with supervision cues. We role played her metric of care and connect to achieve her quality control scores with occasional min questioning cues.     07/16/23: Anamika has been cleared to return to work 07/21/23. She expressed concern about returning with no restrictions. She has misplaced the handouts re: possible accommodations she can request at work - we reviewed these and new handout provided. Trained in energy conservation after work on her days off. Targeted organization strategies to support attention and memory including preparing clothes, snacks and meds the night before work, label files and documents in paper or on your computer - continue to reduce clutter. She has carried over  initiating clutter reduction in her office but still has work to do. See Patient Instructions.   07/08/23: Reported busy day yesterday which was physically and cognitive fatiguing. Indicated frustration related to busy schedule but frustration not correlated to cognitive functioning. Endorsed cognition is better but reported difficulty with recalling items when transferring locations. Recommended verbal coaching, internal repetition, and/or mental picture to assist with encoding information and accuracy of recall. Discussed possible accommodations for return to work to support cognitive functioning, see pt instructions. Endorsed return to prior iADLs x3 with success. Additional recommendations of external compensations to support cognition also in pt instructions. Pt verbalized understanding.   06/18/23: Endorsed some frustration during bill management this morning d/t overwhelm and need for prioritization. SLP initiated use of written organizer for bill pay management and to support cognitive functioning. Provided recommendations to manage to-do list and written organizers to reduce cognitive overload, see pt instructions. Reportedly forgot to call company yesterday, in which pt self-implemented use  of reminder app to support prospective recall. Recommended writing detailed notes/comments during phone calls to support short term recall.   06/16/23: Unique is successfully using reminder app in conjunction with alarms on phone. She reports return to cooking and light cleaning. We generated strategies to incorporate energy conservation with memory and attention including setting weekly priorities and daily to do lists to have written goals - cross off tasks as they are completed. She identified communicating with her daughter's track coach and filling out paperwork for school system as priorities this week as well on checking on bills. We generated strategy of breaking large tasks (bill paying) into smaller 15-20  minute increments with breaks to support attention. 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 See patient instructions.   06/11/23: ST evaluation and POC complete. Initiated education and instruction of external memory compensations, including reminders app on phone to aid prospective recall. Pt had been using alarms with varied success. Able to set up reminder with rare min A. Continue to educate and train cognitive compensations in upcoming sessions.    PATIENT EDUCATION: Education details: see above Person educated: Patient Education method: Explanation Education comprehension: verbalized understanding and needs further education   GOALS: Goals reviewed with patient? Yes  SHORT TERM GOALS: Target date: 07/09/2023  Pt will teach back cognitive compensations x3 to support recall/attention  Baseline:  Goal status: PARTIALLY MET  2.   Pt will recall pertinent information across therapy sessions x2 with use of memory compensations  Baseline: 06/18/23, 07/09/23 Goal status: MET  3.  Pt will increase active engagement in prior iADLs x2 with use of trained compensations Baseline:  Goal status: MET  4.  Pt will demonstrate increased attention to detail with ability to self-correct 95% of errors on structured task  Baseline:  Goal status: PARTIALLY MET    LONG TERM GOALS: Target date: 08/07/2023  Pt will effectively participate in role play work scenario with adequate attention/recall exhibited with use of trained techniques  Baseline: 07/30/23 Goal status: MET  2.  Pt will report less reliance on family to support cognitive functioning at home > 1 week with use of trained techniques  Baseline:  Goal status: ONGOING  3.  Pt will report implementation of cognitive strategies x2 to support work functioning with success indicated  Baseline:  Goal status:  ONGOING   ASSESSMENT:  CLINICAL IMPRESSION: Patient is a 43 y.o. F who was seen today for ST tx s/p stroke. Continued education and instruction of cognitive compensations to optimize daily functioning, with some carryover exhibited. Due to change in baseline, pt would benefit from skilled ST intervention to optimize cognitive functioning and QoL.   OBJECTIVE IMPAIRMENTS: include attention, memory, awareness, and executive functioning. These impairments are limiting patient from return to work and household responsibilities.Factors affecting potential to achieve goals and functional outcome are none. Patient will benefit from skilled SLP services to address above impairments and improve overall function.  REHAB POTENTIAL: Good  PLAN:  SLP FREQUENCY: 1-2x/week  SLP DURATION: 8 weeks  PLANNED INTERVENTIONS: Environmental controls, Cueing hierachy, Cognitive reorganization, Internal/external aids, Functional tasks, SLP instruction and feedback, Compensatory strategies, Patient/family education, [phone] Treatment of speech (30 or 45 min) , and 07476- Speech Eval Sound Prod, Artic, Phon, Eval Compre, Express    [employer], CCC-SLP 08/06/2023, 8:03 AM

## 2023-08-06 ENCOUNTER — Ambulatory Visit: Attending: Neurosurgery

## 2023-08-06 DIAGNOSIS — R41841 Cognitive communication deficit: Secondary | ICD-10-CM | POA: Insufficient documentation

## 2023-08-12 ENCOUNTER — Encounter: Payer: Self-pay | Admitting: Neurosurgery

## 2023-08-14 ENCOUNTER — Ambulatory Visit
Admission: RE | Admit: 2023-08-14 | Discharge: 2023-08-14 | Disposition: A | Discharge status: Home / Self Care | Source: Ambulatory Visit | Attending: Neurosurgery | Admitting: Neurosurgery

## 2023-08-14 DIAGNOSIS — I609 Nontraumatic subarachnoid hemorrhage, unspecified: Secondary | ICD-10-CM

## 2023-08-14 DIAGNOSIS — R220 Localized swelling, mass and lump, head: Secondary | ICD-10-CM | POA: Diagnosis not present

## 2023-08-14 MED ORDER — IOPAMIDOL (ISOVUE-370) INJECTION 76%
75.0000 mL | Freq: Once | INTRAVENOUS | Status: AC | PRN
  Administered 2023-08-14: 75 mL via INTRAVENOUS

## 2023-08-27 DIAGNOSIS — I609 Nontraumatic subarachnoid hemorrhage, unspecified: Secondary | ICD-10-CM | POA: Diagnosis not present

## 2023-08-27 DIAGNOSIS — Z6838 Body mass index (BMI) 38.0-38.9, adult: Secondary | ICD-10-CM | POA: Diagnosis not present

## 2023-09-01 NOTE — Therapy (Unsigned)
 OUTPATIENT SPEECH LANGUAGE PATHOLOGY TREATMENT   Patient Name: Madeline Gomez MRN: [id number] DOB:06-Jun-1980, 43 y.o., female Today's Date: 09/01/2023  PCP: none REFERRING PROVIDER: Lanis Pupa, MD  END OF SESSION:        Past Medical History:  Diagnosis Date   Anemia    Asthma    Broken bones    RIGHT FOOT    BV (bacterial vaginosis)    RECURRENT   H/O vitamin D  deficiency    Thyroid  disease    Past Surgical History:  Procedure Laterality Date   CESAREAN SECTION     IR ANGIO INTRA EXTRACRAN SEL INTERNAL CAROTID BILAT MOD SED  04/15/2023   IR ANGIO INTRA EXTRACRAN SEL INTERNAL CAROTID BILAT MOD SED  04/22/2023   IR ANGIO VERTEBRAL SEL VERTEBRAL BILAT MOD SED  04/15/2023   IR ANGIO VERTEBRAL SEL VERTEBRAL BILAT MOD SED  04/22/2023   IR US  GUIDE VASC ACCESS RIGHT  04/15/2023   RADIOLOGY WITH ANESTHESIA N/A 04/15/2023   Procedure: RADIOLOGY WITH ANESTHESIA;  Surgeon: Lanis Pupa, MD;  Location: [facility] OR;  Service: Radiology;  Laterality: N/A;   WISDOM TOOTH EXTRACTION     Patient Active Problem List   Diagnosis Date Noted   Mild persistent asthma without complication 04/17/2023   Asthma, persistent controlled 04/16/2023   SAH (subarachnoid hemorrhage) (HCC) 04/14/2023   Allergic rhinitis 01/02/2015   Asthma with acute exacerbation 05/02/2014   Asthma, chronic 07/06/2012    ONSET DATE: 06/04/2023 (referral date)   REFERRING DIAG: S06.6XAA (ICD-10-CM) - Subarachnoid hematom  THERAPY DIAG: No diagnosis found.  Rationale for Evaluation and Treatment: Rehabilitation  SUBJECTIVE:   SUBJECTIVE STATEMENT: okay Pt accompanied by: self  PERTINENT HISTORY: PMH: asthma, thyroid  disease, anemia   Pt is a 43 y.o. female presenting 3/10 with frontal headache, dizziness, and nausea. 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 Now s/p radiology with anaesthesia in which no aneurysm was found. PMH significant for mild persistent  asthma, thyroid  disease, anemia.   PAIN:  Are you having pain? No  FALLS: Has patient fallen in last 6 months?  Yes - slipped in bathtub a couple months ago  LIVING ENVIRONMENT: Lives with: lives with their family Lives in: House/apartment  PLOF:  Level of assistance: Independent with ADLs, Independent with IADLs Employment: Full-time employment  PATIENT GOALS: return to work   OBJECTIVE:  Note: Objective measures were completed at Evaluation unless otherwise noted.                                                                                                                           TREATMENT DATE:  09/02/23:  08/06/23: Discussed recent personal events increasing stress and anxiety. Provided recommendations for energy conversation and reducing cognitive overload in light of these events. Reviewed possible work accommodations related to upcoming return to work. Pt able to recall several previously discussed accommodations with good accuracy. Provided handout with potential accommodations, as SLP unable to complete accomodation  paperwork per therapy department policy. Re-educated external and internal memory strategies, including how to modify recall reminders on phone and acting immediately when possible to reduce need for extended recall. Pt verbalized understanding.   07/29/23: Reported updated return to work date and request for SLP to complete accomodation form - pending supervisor approval. Targeted work simulated task, with pt recalling popular restaurants and potential limitations. Pt able to recall x13 restaurants with occasional extra processing time. Recommended targeting work-related tasks (ex: recalling important places/details and research) as part of HEP to maximize cognitive functioning pending upcoming return to work.   07/21/23: Lesha is demonstrating adequate carryover of energy conservation as well as strategies to support attention and concentration by continuing to  de-clutter her office. She has pre-planned her day to go to 3 grocery stores with lists for each store and lists organized according to department in the grocery store. She verbalizes that she will take breaks in between stores and will need to rest after this shopping. She reports most chores at home are being completed with minimal A from family and endorses increased independence at home. Role play different situations and problems she may encounter upon return to work for [employer], including last minute reservations, disclosing up front fees and recalling the most popular hot spots in [place], [place], [place] etc. She verbalized [employer] websites that inform their customers to find preferred tickets and shows. Kadisha Id'd 5 other services she deals with including floral, gifts, spirits., and spa services, tee times with supervision cues. We role played her metric of care and connect to achieve her quality control scores with occasional min questioning cues.     07/16/23: Jettie has been cleared to return to work 07/21/23. She expressed concern about returning with no restrictions. She has misplaced the handouts re: possible accommodations she can request at work - we reviewed these and new handout provided. Trained in energy conservation after work on her days off. Targeted organization strategies to support attention and memory including preparing clothes, snacks and meds the night before work, label files and documents in paper or on your computer - continue to reduce clutter. She has carried over initiating clutter reduction in her office but still has work to do. See Patient Instructions.   07/08/23: Reported busy day yesterday which was physically and cognitive fatiguing. Indicated frustration related to busy schedule but frustration not correlated to cognitive functioning. Endorsed cognition is better but reported difficulty with recalling items when transferring locations. Recommended verbal  coaching, internal repetition, and/or mental picture to assist with encoding information and accuracy of recall. Discussed possible accommodations for return to work to support cognitive functioning, see pt instructions. Endorsed return to prior iADLs x3 with success. Additional recommendations of external compensations to support cognition also in pt instructions. Pt verbalized understanding.   06/18/23: Endorsed some frustration during bill management this morning d/t overwhelm and need for prioritization. SLP initiated use of written organizer for bill pay management and to support cognitive functioning. Provided recommendations to manage to-do list and written organizers to reduce cognitive overload, see pt instructions. Reportedly forgot to call company yesterday, in which pt self-implemented use of reminder app to support prospective recall. Recommended writing detailed notes/comments during phone calls to support short term recall.   06/16/23: Briggette is successfully using reminder app in conjunction with alarms on phone. She reports return to cooking and light cleaning. We generated strategies to incorporate energy conservation with memory and attention including setting weekly priorities and  daily to do lists to have written goals - cross off tasks as they are completed. She identified communicating with her daughter's track coach and filling out paperwork for school system as priorities this week as well on checking on bills. We generated strategy of breaking large tasks (bill paying) into smaller 15-20 minute increments with breaks to support attention. 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 See patient instructions.   06/11/23: ST evaluation and POC complete. Initiated education and instruction of external memory compensations, including reminders app on phone to aid  prospective recall. Pt had been using alarms with varied success. Able to set up reminder with rare min A. Continue to educate and train cognitive compensations in upcoming sessions.    PATIENT EDUCATION: Education details: see above Person educated: Patient Education method: Explanation Education comprehension: verbalized understanding and needs further education   GOALS: Goals reviewed with patient? Yes  SHORT TERM GOALS: Target date: 07/09/2023  Pt will teach back cognitive compensations x3 to support recall/attention  Baseline:  Goal status: PARTIALLY MET  2.   Pt will recall pertinent information across therapy sessions x2 with use of memory compensations  Baseline: 06/18/23, 07/09/23 Goal status: MET  3.  Pt will increase active engagement in prior iADLs x2 with use of trained compensations Baseline:  Goal status: MET  4.  Pt will demonstrate increased attention to detail with ability to self-correct 95% of errors on structured task  Baseline:  Goal status: PARTIALLY MET    LONG TERM GOALS: Target date: 08/07/2023  Pt will effectively participate in role play work scenario with adequate attention/recall exhibited with use of trained techniques  Baseline: 07/30/23 Goal status: MET  2.  Pt will report less reliance on family to support cognitive functioning at home > 1 week with use of trained techniques  Baseline:  Goal status: ONGOING  3.  Pt will report implementation of cognitive strategies x2 to support work functioning with success indicated  Baseline:  Goal status: ONGOING   ASSESSMENT:  CLINICAL IMPRESSION: Patient is a 43 y.o. F who was seen today for ST tx s/p stroke. Continued education and instruction of cognitive compensations to optimize daily functioning, with some carryover exhibited. Due to change in baseline, pt would benefit from skilled ST intervention to optimize cognitive functioning and QoL.   OBJECTIVE IMPAIRMENTS: include attention, memory,  awareness, and executive functioning. These impairments are limiting patient from return to work and household responsibilities.Factors affecting potential to achieve goals and functional outcome are none. Patient will benefit from skilled SLP services to address above impairments and improve overall function.  REHAB POTENTIAL: Good  PLAN:  SLP FREQUENCY: 1-2x/week  SLP DURATION: 8 weeks  PLANNED INTERVENTIONS: Environmental controls, Cueing hierachy, Cognitive reorganization, Internal/external aids, Functional tasks, SLP instruction and feedback, Compensatory strategies, Patient/family education, [phone] Treatment of speech (30 or 45 min) , and 07476- Speech Eval Sound Prod, Artic, Phon, Eval Compre, Express    [employer], CCC-SLP 09/01/2023, 1:18 PM

## 2023-09-02 ENCOUNTER — Ambulatory Visit

## 2023-09-02 DIAGNOSIS — R41841 Cognitive communication deficit: Secondary | ICD-10-CM

## 2023-09-25 ENCOUNTER — Other Ambulatory Visit (HOSPITAL_COMMUNITY): Payer: Self-pay

## 2023-10-07 ENCOUNTER — Other Ambulatory Visit (HOSPITAL_COMMUNITY): Payer: Self-pay

## 2024-01-16 DIAGNOSIS — M79672 Pain in left foot: Secondary | ICD-10-CM | POA: Diagnosis not present

## 2024-01-16 DIAGNOSIS — R202 Paresthesia of skin: Secondary | ICD-10-CM | POA: Diagnosis not present

## 2024-01-16 DIAGNOSIS — M5459 Other low back pain: Secondary | ICD-10-CM | POA: Diagnosis not present

## 2024-01-26 DIAGNOSIS — M5416 Radiculopathy, lumbar region: Secondary | ICD-10-CM | POA: Diagnosis not present

## 2024-01-26 DIAGNOSIS — M79672 Pain in left foot: Secondary | ICD-10-CM | POA: Diagnosis not present
